# Patient Record
Sex: Male | Born: 1980 | Race: Black or African American | Hispanic: No | Marital: Married | Smoking: Never smoker
Health system: Southern US, Community
[De-identification: ages and names within clinical notes are randomized; demographics above are authoritative.]

---
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
## 2014-06-25 ENCOUNTER — Encounter (HOSPITAL_COMMUNITY): Payer: Self-pay | Admitting: Emergency Medicine

## 2014-06-25 ENCOUNTER — Emergency Department (HOSPITAL_COMMUNITY)
Admission: EM | Admit: 2014-06-25 | Discharge: 2014-06-26 | Disposition: A | Discharge status: Home / Self Care | Payer: Medicaid Other | Source: Home / Self Care

## 2014-06-25 DIAGNOSIS — L600 Ingrowing nail: Secondary | ICD-10-CM

## 2014-06-25 DIAGNOSIS — B999 Unspecified infectious disease: Secondary | ICD-10-CM

## 2014-06-25 MED ORDER — CEPHALEXIN 500 MG PO CAPS: 500.0000 mg | ORAL_CAPSULE | Freq: Three times a day (TID) | ORAL | Status: DC

## 2014-06-25 MED ORDER — CHLORHEXIDINE GLUCONATE 4 % EX SOLN: CUTANEOUS | Status: DC

## 2014-06-25 NOTE — Discharge Instructions (Signed)
Your toe infection needs antibiotics and foot cleansing Please start the antibiotics adn follow up with a primary doctor or podiatrist to have part of your toenails removed Please let your toenails grow out Please use the body cleanser daily on your toes. Please soak your feet daily in war soapy water

## 2014-06-25 NOTE — ED Notes (Signed)
Pt  Reports   Symptoms     Of        painfull   big  Toes      With   What   Appears       To  Be  Ingrown  Toenails           He  Recently  Relocated   From  [place]   [place]    Weeks ago           He  Was on  Amoxicillin    Recently  For  The     Symptoms

## 2014-06-25 NOTE — ED Provider Notes (Signed)
CSN: [id number]     Arrival date & time 06/25/14  1428 History   None    Chief Complaint  Patient presents with  . Toe Pain   (Consider location/radiation/quality/duration/timing/severity/associated sxs/prior Treatment) HPI BIlateral big toe pain: ongoing off and on for 12 years. Current episode for 2 mo. Lateral big toenail bilat ingrown into skin causing bleeding, pain. Occasional purulence. Ibuprofen. PCN injections and oral ABX. Pt came from [place]  1 week ago. No toenail removal in the past. Walks ok. Wears open toed shoes.   History reviewed. No pertinent past medical history. History reviewed. No pertinent past surgical history. Family History  Problem Relation Age of Onset  . Diabetes Mother    History  Substance Use Topics  . Smoking status: Never Smoker   . Smokeless tobacco: Not on file  . Alcohol Use: No    Review of Systems Per HPI with all other pertinent systems negative.   Allergies  Review of patient's allergies indicates no known allergies.  Home Medications   Prior to Admission medications   Medication Sig Start Date End Date Taking? Authorizing Provider  cephALEXin (KEFLEX) 500 MG capsule Take 1 capsule (500 mg total) by mouth 3 (three) times daily. 06/25/14   Ozella Rocksavid J Merrell, MD  Chlorhexidine Gluconate 4 % SOLN Wash toes daily 06/25/14   Ozella Rocksavid J Merrell, MD   There were no vitals taken for this visit. Physical Exam  Constitutional: He is oriented to person, place, and time. He appears well-developed and well-nourished.  HENT:  Head: Normocephalic and atraumatic.  Eyes: EOM are normal. Pupils are equal, round, and reactive to light.  Neck: Normal range of motion.  Cardiovascular: Normal rate and intact distal pulses.  Exam reveals no gallop.   No murmur heard. Pulmonary/Chest: Effort normal. No respiratory distress. He has no wheezes.  Abdominal: Soft. Bowel sounds are normal.  Musculoskeletal: Normal range of motion. He exhibits tenderness.    Neurological: He is oriented to person, place, and time.  Skin: Skin is warm.  Psychiatric: He has a normal mood and affect. His behavior is normal. Thought content normal.      ED Course  Procedures (including critical care time) Labs Review Labs Reviewed  WOUND CULTURE    Imaging Review No results found.   MDM   1. Ingrown toenail   2. Infection   PT would greatly benefit from bilat laterqal 1/3 toenail excision w/ chemical ablation of the nail matrix.  Pt is actively trying to get insurance For now will treat w/ Keflex, warm soapy soaks, and allowing the toenail to grow completely out.   Precautions given and all questions answered   Shelly Flattenavid Merrell, MD Family Medicine 07/04/2014, 3:50 AM      Ozella Rocksavid J Merrell, MD 07/04/14 [phone]

## 2014-06-28 LAB — WOUND CULTURE

## 2014-06-30 NOTE — ED Notes (Signed)
Wound culture R toe: Mod. Staph Aureus.  Pt. adequately treated with Keflex. Jeffrey Lowe, Jeffrey Lowe 06/30/2014

## 2014-07-14 ENCOUNTER — Encounter (HOSPITAL_COMMUNITY): Payer: Self-pay | Admitting: Emergency Medicine

## 2014-07-14 ENCOUNTER — Emergency Department (INDEPENDENT_AMBULATORY_CARE_PROVIDER_SITE_OTHER)
Admission: EM | Admit: 2014-07-14 | Discharge: 2014-07-14 | Disposition: A | Discharge status: Home / Self Care | Payer: Medicaid Other | Source: Home / Self Care | Attending: Family Medicine | Admitting: Family Medicine

## 2014-07-14 DIAGNOSIS — L600 Ingrowing nail: Secondary | ICD-10-CM

## 2014-07-14 NOTE — ED Notes (Signed)
Pt is here today for worsening infection on both great toes, pt says that this problem started 2 months ago and that they were [place], pt said that he has not seen his PCP or a podiatrist

## 2014-07-14 NOTE — Discharge Instructions (Signed)
Soak in warm salt water three times a day. Contact [facility] for appointment. Please do not trim your toenails quite so short. This will only make condition worse.  Ingrown Toenail An ingrown toenail occurs when the sharp edge of your toenail grows into the skin. Causes of ingrown toenails include toenails clipped too far back or poorly fitting shoes. Activities involving sudden stops (basketball, tennis) causing "toe jamming" may lead to an ingrown nail. HOME CARE INSTRUCTIONS   Soak the whole foot in warm soapy water for 20 minutes, 3 times per day.  You may lift the edge of the nail away from the sore skin by wedging a small piece of cotton under the corner of the nail. Be careful not to dig (traumatize) and cause more injury to the area.  Wear shoes that fit well. While the ingrown nail is causing problems, sandals may be beneficial.  Trim your toenails regularly and carefully. Cut your toenails straight across, not in a curve. This will prevent injury to the skin at the corners of the toenail.  Keep your feet clean and dry.  Crutches may be helpful early in treatment if walking is painful.  Antibiotics, if prescribed, should be taken as directed.  Return for a wound check in 2 days or as directed.  Only take over-the-counter or prescription medicines for pain, discomfort, or fever as directed by your caregiver. SEEK IMMEDIATE MEDICAL CARE IF:   You have a fever.  You have increasing pain, redness, swelling, or heat at the wound site.  Your toe is not better in 7 days. If conservative treatment is not successful, surgical removal of a portion or all of the nail may be necessary. MAKE SURE YOU:   Understand these instructions.  Will watch your condition.  Will get help right away if you are not doing well or get worse. Document Released: 08/18/2000 Document Revised: 11/13/2011 Document Reviewed: 08/12/2008 [facility] Patient Information 2015 [employer] [place]. This  information is not intended to replace advice given to you by your health care provider. Make sure you discuss any questions you have with your health care provider.

## 2014-07-14 NOTE — ED Provider Notes (Signed)
CSN: [id number]     Arrival date & time 07/14/14  1028 History   First MD Initiated Contact with Patient 07/14/14 1047     Chief Complaint  Patient presents with  . Toe Injury   (Consider location/radiation/quality/duration/timing/severity/associated sxs/prior Treatment) HPI Comments: BIlateral big toe pain: ongoing off and on for 12 years. Current episode for 2 mo. Lateral big toenail bilat ingrown into skin causing bleeding, pain. Occasional purulence. Using warm water soaks and Ibuprofen at home. Pt came from [place] 1 [place] ago. No toenail removal in the past. Walks ok. Wears open toed shoes. Has been seen here in past for same and has not followed up with PCP. Denies fever/chills  The history is provided by the patient.    History reviewed. No pertinent past medical history. History reviewed. No pertinent past surgical history. Family History  Problem Relation Age of Onset  . Diabetes Mother    History  Substance Use Topics  . Smoking status: Never Smoker   . Smokeless tobacco: Not on file  . Alcohol Use: No    Review of Systems  All other systems reviewed and are negative.   Allergies  Review of patient's allergies indicates no known allergies.  Home Medications   Prior to Admission medications   Medication Sig Start Date End Date Taking? Authorizing Provider  cephALEXin (KEFLEX) 500 MG capsule Take 1 capsule (500 mg total) by mouth 3 (three) times daily. 06/25/14   Ozella Rocksavid J Merrell, MD  Chlorhexidine Gluconate 4 % SOLN Wash toes daily 06/25/14   Ozella Rocksavid J Merrell, MD   BP 135/78 mmHg  Pulse 65  Temp(Src) 98 F (36.7 C) (Oral)  Resp 12  SpO2 100% Physical Exam  Constitutional: He is oriented to person, place, and time. He appears well-developed and well-nourished. No distress.  HENT:  Head: Normocephalic and atraumatic.  Eyes: Conjunctivae are normal. No scleral icterus.  Cardiovascular: Normal rate.   Pulmonary/Chest: Effort normal.  Musculoskeletal: Normal  range of motion.  Neurological: He is alert and oriented to person, place, and time.  Skin: Skin is warm and dry.  Ingrown toenails, bilateral lateral great toe nailbeds with large mounds of associated granulation tissue. No indication of cellulitis/infection/abscess  Psychiatric: He has a normal mood and affect. His behavior is normal.  Nursing note and vitals reviewed.   ED Course  Procedures (including critical care time) Labs Review Labs Reviewed - No data to display  Imaging Review No results found.   MDM   1. Ingrown left big toenail   2. Ingrown right greater toenail    Referred to [facility]. Warm salt water soaks TID. Limit trimming nails quite so short.    Ria ClockJennifer Lee H Arihanna Estabrook, [place] 07/14/14 [phone]

## 2014-08-06 ENCOUNTER — Ambulatory Visit (INDEPENDENT_AMBULATORY_CARE_PROVIDER_SITE_OTHER): Payer: Medicaid Other | Admitting: Podiatry

## 2014-08-06 ENCOUNTER — Encounter: Payer: Self-pay | Admitting: Podiatry

## 2014-08-06 VITALS — BP 132/80 | HR 61 | Temp 98.8°F | Resp 11

## 2014-08-06 DIAGNOSIS — L03039 Cellulitis of unspecified toe: Secondary | ICD-10-CM

## 2014-08-06 MED ORDER — CEPHALEXIN 500 MG PO CAPS: 500.0000 mg | ORAL_CAPSULE | Freq: Three times a day (TID) | ORAL | Status: DC

## 2014-08-06 NOTE — Progress Notes (Signed)
Subjective:     Patient ID: Jeffrey Lowe, male   DOB: 11/06/1980, 33 y.o.   MRN: [id number]  HPI patient states I have to very bad infected ingrown toenails that have been treated with antibiotics and they have not really improved and I was referred here for treatment   Review of Systems  All other systems reviewed and are negative.      Objective:   Physical Exam  Constitutional: He is oriented to person, place, and time.  Cardiovascular: Intact distal pulses.   Musculoskeletal: Normal range of motion.  Neurological: He is oriented to person, place, and time.  Skin: Skin is warm.  Nursing note and vitals reviewed.  neurovascular status intact with muscle strength adequate range of motion subtalar and midtarsal joint within normal limits. Patient is noted to have an inflamed infected area left hallux lateral border right hallux medial border that are localized in nature with quite a bit of necrotic tissue on both. Digits are well-perfused and patient is well oriented     Assessment:     Severe paronychia infection hallux both feet    Plan:     H&P and condition reviewed x-ray length with patient. Today I infiltrated each hallux 60 mg Xylocaine Marcaine mixture remove the lateral border left hallux proud flesh abscess tissue and allowed channel for drainage and medial border right hallux removing proud flesh abscess tissue and allowing channel for drainage. Applied sterile dressings placed on cephalexin 500 mg 3 times a day and reappoint in 5 weeks for permanent correction or earlier if needed

## 2014-08-06 NOTE — Progress Notes (Signed)
   Subjective:    Patient ID: Jeffrey Lowe, male    DOB: 09/23/1980, 33 y.o.   MRN: [id number]  HPI Comments: Pt [facility], with discolored, swollen B/L 1st toes.  Pt states the toes have been this way for 3 months.     Review of Systems  All other systems reviewed and are negative.      Objective:   Physical Exam        Assessment & Plan:

## 2014-08-06 NOTE — Patient Instructions (Signed)

## 2014-08-21 ENCOUNTER — Ambulatory Visit (INDEPENDENT_AMBULATORY_CARE_PROVIDER_SITE_OTHER): Payer: Medicaid Other | Admitting: Family Medicine

## 2014-08-21 ENCOUNTER — Encounter: Payer: Self-pay | Admitting: Family Medicine

## 2014-08-21 VITALS — BP 109/62 | HR 58 | Temp 97.5°F | Ht 72.0 in | Wt 199.9 lb

## 2014-08-21 DIAGNOSIS — R894 Abnormal immunological findings in specimens from other organs, systems and tissues: Secondary | ICD-10-CM

## 2014-08-21 DIAGNOSIS — R768 Other specified abnormal immunological findings in serum: Secondary | ICD-10-CM | POA: Insufficient documentation

## 2014-08-21 DIAGNOSIS — Z0289 Encounter for other administrative examinations: Secondary | ICD-10-CM

## 2014-08-21 DIAGNOSIS — Z008 Encounter for other general examination: Secondary | ICD-10-CM

## 2014-08-21 LAB — CBC WITH DIFFERENTIAL/PLATELET
Basophils Absolute: 0 10*3/uL (ref 0.0–0.1)
Basophils Relative: 0 % (ref 0–1)
EOS ABS: 0.3 10*3/uL (ref 0.0–0.7)
Eosinophils Relative: 8 % — ABNORMAL HIGH (ref 0–5)
HCT: 45.1 % (ref 39.0–52.0)
HEMOGLOBIN: 15.7 g/dL (ref 13.0–17.0)
Lymphocytes Relative: 46 % (ref 12–46)
Lymphs Abs: 1.7 10*3/uL (ref 0.7–4.0)
MCH: 31.1 pg (ref 26.0–34.0)
MCHC: 34.8 g/dL (ref 30.0–36.0)
MCV: 89.3 fL (ref 78.0–100.0)
MONO ABS: 0.3 10*3/uL (ref 0.1–1.0)
MPV: 10.2 fL (ref 9.4–12.4)
Monocytes Relative: 7 % (ref 3–12)
NEUTROS PCT: 39 % — AB (ref 43–77)
Neutro Abs: 1.4 10*3/uL — ABNORMAL LOW (ref 1.7–7.7)
Platelets: 184 10*3/uL (ref 150–400)
RBC: 5.05 MIL/uL (ref 4.22–5.81)
RDW: 14.4 % (ref 11.5–15.5)
WBC: 3.6 10*3/uL — ABNORMAL LOW (ref 4.0–10.5)

## 2014-08-21 LAB — COMPREHENSIVE METABOLIC PANEL
ALT: 40 U/L (ref 0–53)
AST: 32 U/L (ref 0–37)
Albumin: 4.2 g/dL (ref 3.5–5.2)
Alkaline Phosphatase: 73 U/L (ref 39–117)
BILIRUBIN TOTAL: 1 mg/dL (ref 0.2–1.2)
BUN: 12 mg/dL (ref 6–23)
CO2: 29 meq/L (ref 19–32)
CREATININE: 1.11 mg/dL (ref 0.50–1.35)
Calcium: 9.3 mg/dL (ref 8.4–10.5)
Chloride: 102 mEq/L (ref 96–112)
Glucose, Bld: 81 mg/dL (ref 70–99)
Potassium: 4 mEq/L (ref 3.5–5.3)
Sodium: 140 mEq/L (ref 135–145)
Total Protein: 7.5 g/dL (ref 6.0–8.3)
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
## 2014-08-21 NOTE — Patient Instructions (Addendum)
Great to meet you!  For your back pain:  Stretch like Dr. Gwendolyn GrantWalden showed you every 30 minutes to an hour Continue massages  Try ibuprofen, Take 2 pills (400 mg) every 6 hours as needed for pain  You should see a dentist Follow up in 6 months
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
## 2014-08-22 LAB — ACUTE HEP PANEL AND HEP B SURFACE AB
HCV AB: NEGATIVE
HEP A IGM: NONREACTIVE
HEP B S AG: POSITIVE — AB
Hep B C IgM: NONREACTIVE
Hep B S Ab: NEGATIVE

## 2014-08-22 LAB — HEPATITIS B SURF AG CONFIRMATION: Hepatitis B Surf Ag Confirmation: POSITIVE — AB

## 2014-09-10 ENCOUNTER — Encounter: Payer: Self-pay | Admitting: Podiatry

## 2014-09-10 ENCOUNTER — Ambulatory Visit (INDEPENDENT_AMBULATORY_CARE_PROVIDER_SITE_OTHER): Payer: Medicaid Other | Admitting: Podiatry

## 2014-09-10 VITALS — BP 132/80 | HR 61 | Resp 16

## 2014-09-10 DIAGNOSIS — L600 Ingrowing nail: Secondary | ICD-10-CM

## 2014-09-10 NOTE — Patient Instructions (Signed)

## 2014-09-10 NOTE — Progress Notes (Signed)
Subjective:     Patient ID: Jerene DillingPrince Rauls, male   DOB: [birth year], 34 y.o.   MRN: [id number]  HPI patient presents stating my nails have healed very well and I like them fixed permanently   Review of Systems     Objective:   Physical Exam Neurovascular status is intact with muscle strength adequate and range of motion subtalar midtarsal joint within normal limits with incurvated hallux borders lateral bilateral that if healed well but are continuing to be ingrown    Assessment:     Healed from paronychia infection with ingrown toenail bilateral    Plan:     H&P performed and discussed condition. I've recommended removal of the corners and explained the surgery and at this time I infiltrated each hallux 60 mg Xylocaine Marcaine mixture and remove the lateral borders exposing matrix and applying phenol 3 applications 30 seconds followed by alcohol lavaged and sterile dressings. Gave instructions on soaks and reappoint

## 2014-10-19 ENCOUNTER — Emergency Department (HOSPITAL_COMMUNITY)
Admission: EM | Admit: 2014-10-19 | Discharge: 2014-10-19 | Disposition: A | Discharge status: Home / Self Care | Payer: Medicaid Other | Source: Home / Self Care | Attending: Family Medicine | Admitting: Family Medicine

## 2014-10-19 ENCOUNTER — Encounter (HOSPITAL_COMMUNITY): Payer: Self-pay | Admitting: Emergency Medicine

## 2014-10-19 DIAGNOSIS — M25512 Pain in left shoulder: Secondary | ICD-10-CM

## 2014-10-19 DIAGNOSIS — B354 Tinea corporis: Secondary | ICD-10-CM

## 2014-10-19 MED ORDER — CYCLOBENZAPRINE HCL 10 MG PO TABS: 10.0000 mg | ORAL_TABLET | Freq: Two times a day (BID) | ORAL | Status: DC | PRN

## 2014-10-19 MED ORDER — TERBINAFINE HCL 250 MG PO TABS: 250.0000 mg | ORAL_TABLET | Freq: Every day | ORAL | Status: DC

## 2014-10-19 MED ORDER — CLOTRIMAZOLE-BETAMETHASONE 1-0.05 % EX CREA
TOPICAL_CREAM | CUTANEOUS | Status: DC
Start: 2014-10-19 — End: 2015-12-15

## 2014-10-19 NOTE — ED Provider Notes (Signed)
CSN: [id number]     Arrival date & time 10/19/14  1526 History   First MD Initiated Contact with Patient 10/19/14 1631     Chief Complaint  Patient presents with  . Rash   (Consider location/radiation/quality/duration/timing/severity/associated sxs/prior Treatment) HPI              34 year old male presents complaining of left shoulder pain as well as a rash. He is of the rash for about 3 weeks. It started on the inside of his left thigh and has since spread to the opposite thigh, right shoulder. It is itchy and raised. No systemic symptoms, no recent travel. No treatment stridor home. Also he complains of left shoulder pain. He does a lot of heavy lifting at work and he thinks this is muscular pain. He has been taking ibuprofen without much relief. Denies any specific injury. No numbness or weakness.  History reviewed. No pertinent past medical history. Past Surgical History  Procedure Laterality Date  . Hernia repair  2002   Family History  Problem Relation Age of Onset  . Diabetes Mother   . Hepatitis B Brother     Tested positive at same time as him   History  Substance Use Topics  . Smoking status: Never Smoker   . Smokeless tobacco: Not on file  . Alcohol Use: No    Review of Systems  Constitutional: Negative for fever and chills.  Musculoskeletal: Positive for myalgias and joint swelling. Negative for back pain, neck pain and neck stiffness.  Skin: Positive for rash.  All other systems reviewed and are negative.   Allergies  Review of patient's allergies indicates no known allergies.  Home Medications   Prior to Admission medications   Medication Sig Start Date End Date Taking? Authorizing Provider  Chlorhexidine Gluconate 4 % SOLN Wash toes daily 06/25/14   Ozella Rocks, MD  clotrimazole-betamethasone (LOTRISONE) cream Apply to affected area 2 times daily 10/19/14   Graylon Good, PA-C  cyclobenzaprine (FLEXERIL) 10 MG tablet Take 1 tablet (10 mg total) by  mouth 2 (two) times daily as needed for muscle spasms. 10/19/14   Graylon Good, PA-C  terbinafine (LAMISIL) 250 MG tablet Take 1 tablet (250 mg total) by mouth daily. 10/19/14   Adrian Blackwater Easton Fetty, PA-C   BP 125/81 mmHg  Pulse 60  Temp(Src) 97.1 F (36.2 C) (Oral)  Resp 16  SpO2 100% Physical Exam  Constitutional: He is oriented to person, place, and time. He appears well-developed and well-nourished. No distress.  HENT:  Head: Normocephalic.  Cardiovascular: Normal rate, regular rhythm and normal heart sounds.   Pulses:      Radial pulses are 2+ on the right side, and 2+ on the left side.  Pulmonary/Chest: Effort normal and breath sounds normal. No respiratory distress.  Abdominal: He exhibits no mass. There is no tenderness. There is no rebound and no guarding.  Musculoskeletal:       Thoracic back: He exhibits tenderness (left upper trapezius area ). He exhibits no bony tenderness.  Neurological: He is alert and oriented to person, place, and time. No sensory deficit. Coordination normal.  Skin: Skin is warm and dry. Rash (circumscribed rash with raised rolled edge and central clearing on medial distal thighs, right shoulder, left upper back ) noted. He is not diaphoretic.  Psychiatric: He has a normal mood and affect. Judgment normal.  Nursing note and vitals reviewed.   ED Course  Procedures (including critical care time) Labs Review Labs Reviewed -  No data to display  Imaging Review No results found.   MDM   1. Tinea corporis   2. Left shoulder pain    This has the appearance of tinea corporis. It is widespread, treat with terbinafine as well as Lotrisone cream. Flexeril when necessary for shoulder pain and continue ibuprofen. Follow-up when necessary  Meds ordered this encounter  Medications  . terbinafine (LAMISIL) 250 MG tablet    Sig: Take 1 tablet (250 mg total) by mouth daily.    Dispense:  14 tablet    Refill:  0  . clotrimazole-betamethasone (LOTRISONE)  cream    Sig: Apply to affected area 2 times daily    Dispense:  45 g    Refill:  0  . cyclobenzaprine (FLEXERIL) 10 MG tablet    Sig: Take 1 tablet (10 mg total) by mouth 2 (two) times daily as needed for muscle spasms.    Dispense:  20 tablet    Refill:  0     Graylon GoodZachary H Ercilia Bettinger, PA-C 10/19/14 2023

## 2014-10-19 NOTE — Discharge Instructions (Signed)
Body Ringworm Ringworm (tinea corporis) is a fungal infection of the skin on the body. This infection is not caused by worms, but is actually caused by a fungus. Fungus normally lives on the top of your skin and can be useful. However, in the case of ringworms, the fungus grows out of control and causes a skin infection. It can involve any area of skin on the body and can spread easily from one person to another (contagious). Ringworm is a common problem for children, but it can affect adults as well. Ringworm is also often found in athletes, especially wrestlers who share equipment and mats.  CAUSES  Ringworm of the body is caused by a fungus called dermatophyte. It can spread by:  Touchingother people who are infected.  Touchinginfected pets.  Touching or sharingobjects that have been in contact with the infected person or pet (hats, combs, towels, clothing, sports equipment). SYMPTOMS   Itchy, raised red spots and bumps on the skin.  Ring-shaped rash.  Redness near the border of the rash with a clear center.  Dry and scaly skin on or around the rash. Not every person develops a ring-shaped rash. Some develop only the red, scaly patches. DIAGNOSIS  Most often, ringworm can be diagnosed by performing a skin exam. Your caregiver may choose to take a skin scraping from the affected area. The sample will be examined under the microscope to see if the fungus is present.  TREATMENT  Body ringworm may be treated with a topical antifungal cream or ointment. Sometimes, an antifungal shampoo that can be used on your body is prescribed. You may be prescribed antifungal medicines to take by mouth if your ringworm is severe, keeps coming back, or lasts a long time.  HOME CARE INSTRUCTIONS   Only take over-the-counter or prescription medicines as directed by your caregiver.  Wash the infected area and dry it completely before applying yourcream or ointment.  When using antifungal shampoo to  treat the ringworm, leave the shampoo on the body for 3-5 minutes before rinsing.   Wear loose clothing to stop clothes from rubbing and irritating the rash.  Wash or change your bed sheets every night while you have the rash.  Have your pet treated by your veterinarian if it has the same infection. To prevent ringworm:   Practice good hygiene.  Wear sandals or shoes in public places and showers.  Do not share personal items with others.  Avoid touching red patches of skin on other people.  Avoid touching pets that have bald spots or wash your hands after doing so. SEEK MEDICAL CARE IF:   Your rash continues to spread after 7 days of treatment.  Your rash is not gone in 4 weeks.  The area around your rash becomes red, warm, tender, and swollen. Document Released: 08/18/2000 Document Revised: 05/15/2012 Document Reviewed: 03/04/2012 [facility] Patient Information 2015 [place], [place]. This information is not intended to replace advice given to you by your health care provider. Make sure you discuss any questions you have with your health care provider.  Shoulder Pain The shoulder is the joint that connects your arms to your body. The bones that form the shoulder joint include the upper arm bone (humerus), the shoulder blade (scapula), and the collarbone (clavicle). The top of the humerus is shaped like a ball and fits into a rather flat socket on the scapula (glenoid cavity). A combination of muscles and strong, fibrous tissues that connect muscles to bones (tendons) support your shoulder joint  and hold the ball in the socket. Small, fluid-filled sacs (bursae) are located in different areas of the joint. They act as cushions between the bones and the overlying soft tissues and help reduce friction between the gliding tendons and the bone as you move your arm. Your shoulder joint allows a wide range of motion in your arm. This range of motion allows you to do things like scratch your back  or throw a ball. However, this range of motion also makes your shoulder more prone to pain from overuse and injury. Causes of shoulder pain can originate from both injury and overuse and usually can be grouped in the following four categories:  Redness, swelling, and pain (inflammation) of the tendon (tendinitis) or the bursae (bursitis).  Instability, such as a dislocation of the joint.  Inflammation of the joint (arthritis).  Broken bone (fracture). HOME CARE INSTRUCTIONS   Apply ice to the sore area.  Put ice in a plastic bag.  Place a towel between your skin and the bag.  Leave the ice on for 15-20 minutes, 3-4 times per day for the first 2 days, or as directed by your health care provider.  Stop using cold packs if they do not help with the pain.  If you have a shoulder sling or immobilizer, wear it as long as your caregiver instructs. Only remove it to shower or bathe. Move your arm as little as possible, but keep your hand moving to prevent swelling.  Squeeze a soft ball or foam pad as much as possible to help prevent swelling.  Only take over-the-counter or prescription medicines for pain, discomfort, or fever as directed by your caregiver. SEEK MEDICAL CARE IF:   Your shoulder pain increases, or new pain develops in your arm, hand, or fingers.  Your hand or fingers become cold and numb.  Your pain is not relieved with medicines. SEEK IMMEDIATE MEDICAL CARE IF:   Your arm, hand, or fingers are numb or tingling.  Your arm, hand, or fingers are significantly swollen or turn white or blue. MAKE SURE YOU:   Understand these instructions.  Will watch your condition.  Will get help right away if you are not doing well or get worse. Document Released: 05/31/2005 Document Revised: 01/05/2014 Document Reviewed: 08/05/2011 [facility] Patient Information 2015 [place], [place]. This information is not intended to replace advice given to you by your health care provider. Make  sure you discuss any questions you have with your health care provider.  Musculoskeletal Pain Musculoskeletal pain is muscle and boney aches and pains. These pains can occur in any part of the body. Your caregiver may treat you without knowing the cause of the pain. They may treat you if blood or urine tests, X-rays, and other tests were normal.  CAUSES There is often not a definite cause or reason for these pains. These pains may be caused by a type of germ (virus). The discomfort may also come from overuse. Overuse includes working out too hard when your body is not fit. Boney aches also come from weather changes. Bone is sensitive to atmospheric pressure changes. HOME CARE INSTRUCTIONS   Ask when your test results will be ready. Make sure you get your test results.  Only take over-the-counter or prescription medicines for pain, discomfort, or fever as directed by your caregiver. If you were given medications for your condition, do not drive, operate machinery or power tools, or sign legal documents for 24 hours. Do not drink alcohol. Do not take  sleeping pills or other medications that may interfere with treatment.  Continue all activities unless the activities cause more pain. When the pain lessens, slowly resume normal activities. Gradually increase the intensity and duration of the activities or exercise.  During periods of severe pain, bed rest may be helpful. Lay or sit in any position that is comfortable.  Putting ice on the injured area.  Put ice in a bag.  Place a towel between your skin and the bag.  Leave the ice on for 15 to 20 minutes, 3 to 4 times a day.  Follow up with your caregiver for continued problems and no reason can be found for the pain. If the pain becomes worse or does not go away, it may be necessary to repeat tests or do additional testing. Your caregiver may need to look further for a possible cause. SEEK IMMEDIATE MEDICAL CARE IF:  You have pain that is  getting worse and is not relieved by medications.  You develop chest pain that is associated with shortness or breath, sweating, feeling sick to your stomach (nauseous), or throw up (vomit).  Your pain becomes localized to the abdomen.  You develop any new symptoms that seem different or that concern you. MAKE SURE YOU:   Understand these instructions.  Will watch your condition.  Will get help right away if you are not doing well or get worse. Document Released: 08/21/2005 Document Revised: 11/13/2011 Document Reviewed: 04/25/2013 [facility] Patient Information 2015 [place], [place]. This information is not intended to replace advice given to you by your health care provider. Make sure you discuss any questions you have with your health care provider.

## 2014-10-19 NOTE — ED Notes (Signed)
Reports 3 week history of rash to shoulder, right, bilateral thighs, pain in left shoulder

## 2015-01-08 ENCOUNTER — Telehealth: Payer: Self-pay | Admitting: Family Medicine

## 2015-01-08 DIAGNOSIS — B181 Chronic viral hepatitis B without delta-agent: Secondary | ICD-10-CM

## 2015-01-08 NOTE — Telephone Encounter (Signed)
Jeffrey Lowe presented to clinic with his wife earlier in the week.  He had questions about his recent lab tests, which showed positive Hepatitis surface antigen, demonstrating chronic HBV state.  Jeffrey Lowe is interested in talking with an infectious disease specialist about possible treatment options or at least just to learn more about his disease.  I will place a referral today to ID.

## 2015-01-18 NOTE — Telephone Encounter (Signed)
Spoke with patient and he is aware of appt.  Also aware of date and time. Aleene Swanner,CMA

## 2015-01-18 NOTE — Telephone Encounter (Signed)
Please call Jeffrey Lowe to let him know he has an ID appt on May 25 to discuss his hepatitis.  He is awaiting a phone call from our clinic about this.  Let me know if you have questions.  THanks!  JW

## 2015-01-27 ENCOUNTER — Ambulatory Visit: Payer: Medicaid Other | Admitting: Internal Medicine

## 2015-02-16 ENCOUNTER — Ambulatory Visit: Payer: Medicaid Other | Admitting: Internal Medicine

## 2015-02-16 ENCOUNTER — Telehealth: Payer: Self-pay | Admitting: *Deleted

## 2015-02-16 NOTE — Telephone Encounter (Signed)
Made the pt a new appt with Dr. Orvan Falconer for 03/22/15.

## 2015-03-22 ENCOUNTER — Ambulatory Visit: Payer: Medicaid Other | Admitting: Internal Medicine

## 2015-04-01 ENCOUNTER — Encounter: Payer: Self-pay | Admitting: Internal Medicine

## 2015-04-01 ENCOUNTER — Ambulatory Visit (INDEPENDENT_AMBULATORY_CARE_PROVIDER_SITE_OTHER): Payer: Medicaid Other | Admitting: Internal Medicine

## 2015-04-01 DIAGNOSIS — R894 Abnormal immunological findings in specimens from other organs, systems and tissues: Secondary | ICD-10-CM | POA: Diagnosis present

## 2015-04-01 DIAGNOSIS — R768 Other specified abnormal immunological findings in serum: Secondary | ICD-10-CM

## 2015-04-01 LAB — COMPREHENSIVE METABOLIC PANEL
ALT: 34 U/L (ref 9–46)
AST: 27 U/L (ref 10–40)
Albumin: 4.3 g/dL (ref 3.6–5.1)
Alkaline Phosphatase: 63 U/L (ref 40–115)
BUN: 16 mg/dL (ref 7–25)
CALCIUM: 9.4 mg/dL (ref 8.6–10.3)
CHLORIDE: 102 meq/L (ref 98–110)
CO2: 29 mEq/L (ref 20–31)
Creat: 1.17 mg/dL (ref 0.60–1.35)
Glucose, Bld: 85 mg/dL (ref 65–99)
Potassium: 4.5 mEq/L (ref 3.5–5.3)
SODIUM: 139 meq/L (ref 135–146)
Total Bilirubin: 0.7 mg/dL (ref 0.2–1.2)
Total Protein: 7.2 g/dL (ref 6.1–8.1)

## 2015-04-01 LAB — CBC
HEMATOCRIT: 43.9 % (ref 39.0–52.0)
Hemoglobin: 15.4 g/dL (ref 13.0–17.0)
MCH: 31.2 pg (ref 26.0–34.0)
MCHC: 35.1 g/dL (ref 30.0–36.0)
MCV: 89 fL (ref 78.0–100.0)
MPV: 10.1 fL (ref 8.6–12.4)
Platelets: 172 10*3/uL (ref 150–400)
RBC: 4.93 MIL/uL (ref 4.22–5.81)
RDW: 13.3 % (ref 11.5–15.5)
WBC: 4.1 10*3/uL (ref 4.0–10.5)

## 2015-04-01 NOTE — Progress Notes (Signed)
Patient ID: Jeffrey Lowe, male   DOB: 11-28-1980, 34 y.o.   MRN: [id number]         [facility] Infectious Disease  Reason for Consult: Chronic hepatitis B Referring Physician: Dr. Payton Lowe  Patient Active Problem List   Diagnosis Date Noted  . Hepatitis B antibody positive 08/21/2014  . Refugee health examination 08/21/2014    Patient's Medications  New Prescriptions   No medications on file  Previous Medications   CHLORHEXIDINE GLUCONATE 4 % SOLN    Wash toes daily   CLOTRIMAZOLE-BETAMETHASONE (LOTRISONE) CREAM    Apply to affected area 2 times daily   CYCLOBENZAPRINE (FLEXERIL) 10 MG TABLET    Take 1 tablet (10 mg total) by mouth 2 (two) times daily as needed for muscle spasms.   TERBINAFINE (LAMISIL) 250 MG TABLET    Take 1 tablet (250 mg total) by mouth daily.  Modified Medications   No medications on file  Discontinued Medications   No medications on file    Recommendations: 1. Repeat CBC and complete metabolic panel 2. HIV antibody 3. Hepatitis A antibody 4. Hepatitis D antibody 5. Hepatitis B e antigen and antibody 6. Hepatitis B DNA viral load   Assessment: He has chronic hepatitis B. It is unclear how it was acquired. Vertical transmission from his mother is a possibility but he may have been exposed in [place] through contact with other people's blood during the war or with contaminated injections. He does not believe he could of had it through sexual contact. He needs further evaluation to see if he might be a candidate for treatment versus watchful waiting and periodic blood work.  HPI: Jeffrey Lowe is a 34 y.o. male who was born in the [place]. His family was torn apart by Civil War. He lived for a while in [place] and then a refugee camp in [place] before immigrating to the [place] last fall. When he came here he was screened and found to have hepatitis B surface antigen positive. His liver enzymes were normal. One brother who immigrated  with him also tested positive for hepatitis B. He knows that his mother had diabetes but he does not know if she is ever been tested for hepatitis. He has never had a blood transfusion but during the war he was exposed to lots of other people who had bloody wounds. 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 He has never had a blood transfusion. He has never had a tattoo. He has never injected drugs. He is married. His wife has tested negative for hepatitis B. He believes that she has received hepatitis B vaccine. He is healthy and on no medications.  Review of Systems: Constitutional: negative Eyes: negative Ears, nose, mouth, throat, and face: negative Respiratory: negative Cardiovascular: negative Gastrointestinal: negative Genitourinary:negative    No past medical history on file.  History  Substance Use Topics  . Smoking status: Never Smoker   . Smokeless tobacco: Not on file  . Alcohol Use: No    Family History  Problem Relation Age of Onset  . Diabetes Mother   . Hepatitis B Brother     Tested positive at same time as him   No Known Allergies  OBJECTIVE: Blood pressure 111/72, pulse 54, temperature 98.6 F (37 C), temperature source Oral, height 5' 6.93" (1.7 m), weight 202 lb 8 oz (91.853 kg). General: He is healthy and in no distress Skin: No rash Lymph nodes: No palpable adenopathy  Lungs: Clear Cor: Regular S1 and S2 with no murmurs Abdomen: Soft and nontender with no palpable masses   Microbiology: No results found for this or any previous visit (from the past 240 hour(s)).  Jeffrey Asters, MD [facility] for Infectious Disease [facility] [phone] pager   [phone] cell 04/01/2015, 5:26 PM

## 2015-04-02 LAB — HIV ANTIBODY (ROUTINE TESTING W REFLEX): HIV: NONREACTIVE

## 2015-04-02 LAB — HEPATITIS A ANTIBODY, TOTAL: HEP A TOTAL AB: REACTIVE — AB

## 2015-04-05 LAB — HEPATITIS B DNA, ULTRAQUANTITATIVE, PCR
HEPATITIS B DNA (CALC): 501 {copies}/mL — AB (ref <116)
HEPATITIS B DNA: 86 [IU]/mL — AB (ref <20)

## 2015-04-07 LAB — HEPATITIS DELTA ANTIBODY: Hepatitis D Ab, Total: NEGATIVE

## 2015-05-06 ENCOUNTER — Ambulatory Visit (INDEPENDENT_AMBULATORY_CARE_PROVIDER_SITE_OTHER): Payer: Medicaid Other | Admitting: Internal Medicine

## 2015-05-06 ENCOUNTER — Encounter: Payer: Self-pay | Admitting: Internal Medicine

## 2015-05-06 ENCOUNTER — Telehealth: Payer: Self-pay | Admitting: *Deleted

## 2015-05-06 VITALS — BP 106/71 | HR 56 | Temp 98.1°F | Wt 203.0 lb

## 2015-05-06 DIAGNOSIS — Z23 Encounter for immunization: Secondary | ICD-10-CM

## 2015-05-06 DIAGNOSIS — R894 Abnormal immunological findings in specimens from other organs, systems and tissues: Secondary | ICD-10-CM | POA: Diagnosis not present

## 2015-05-06 DIAGNOSIS — R768 Other specified abnormal immunological findings in serum: Secondary | ICD-10-CM

## 2015-05-06 NOTE — Telephone Encounter (Signed)
Requested pt call to schedule lab and MD appts.  The lab appt needs to be 2 weeks prior to the MD appts.

## 2015-05-06 NOTE — Assessment & Plan Note (Signed)
He has chronic hepatitis B. Unfortunately his e antigen and e antibody were not obtained at the time of his last visit. These tests will be important to get more per slice staging. However his liver enzymes remain normal and his viral load is very low. I do not feel he needs any treatment at this time. I will obtain an ultrasound with elastography to establish a baseline and see him back after a full set of lab work in 6 months.

## 2015-05-06 NOTE — Addendum Note (Signed)
Addended by: Jennet Maduro D on: 05/06/2015 11:42 AM   Modules accepted: Orders

## 2015-05-06 NOTE — Telephone Encounter (Addendum)
Pt arrived late for appt.  Was seen by Dr. Orvan Falconer.  Dr. Orvan Falconer ordered an abdominal ultrasound/elastography for the pt.  It is scheduled for 06/03/15 at 0800 at [facility].  Pt to arrive at 0745, NPO for 6 hours.  Letter with this information was sent to the pt.

## 2015-05-06 NOTE — Progress Notes (Signed)
Patient ID: Jeffrey Lowe, male   DOB: 04-23-1981, 34 y.o.   MRN: [id number]         [facility] for Infectious Disease  Patient Active Problem List   Diagnosis Date Noted  . Hepatitis B antibody positive 08/21/2014  . Refugee health examination 08/21/2014    Patient's Medications  New Prescriptions   No medications on file  Previous Medications   CHLORHEXIDINE GLUCONATE 4 % SOLN    Wash toes daily   CLOTRIMAZOLE-BETAMETHASONE (LOTRISONE) CREAM    Apply to affected area 2 times daily   CYCLOBENZAPRINE (FLEXERIL) 10 MG TABLET    Take 1 tablet (10 mg total) by mouth 2 (two) times daily as needed for muscle spasms.   TERBINAFINE (LAMISIL) 250 MG TABLET    Take 1 tablet (250 mg total) by mouth daily.  Modified Medications   No medications on file  Discontinued Medications   No medications on file    Subjective: He is feeling well without any problems or complaints.  Review of Systems: Pertinent items are noted in HPI.  No past medical history on file.  Social History  Substance Use Topics  . Smoking status: Never Smoker   . Smokeless tobacco: None  . Alcohol Use: No    Family History  Problem Relation Age of Onset  . Diabetes Mother   . Hepatitis B Brother     Tested positive at same time as him    No Known Allergies  Objective: Filed Vitals:   05/06/15 1059  BP: 106/71  Pulse: 56  Temp: 98.1 F (36.7 C)  TempSrc: Oral  Weight: 203 lb (92.08 kg)   Body mass index is 31.86 kg/(m^2).  General: He is in good spirits Skin: No rash Oral: He has 1 small ulcer on his right upper lip where he bit himself Lungs: Clear Cor: Regular S1 and S2 with no murmur Abdomen: Soft and nontender with no palpable masses   Lab Results Hepatitis B DNA viral load 04/01/2015: 86 international units per mL Hepatitis A antibody positive Hepatitis C antibody negative Hepatitis D antibody negative   Problem List Items Addressed This Visit      Unprioritized   Hepatitis B antibody positive    He has chronic hepatitis B. Unfortunately his e antigen and e antibody were not obtained at the time of his last visit. These tests will be important to get more per slice staging. However his liver enzymes remain normal and his viral load is very low. I do not feel he needs any treatment at this time. I will obtain an ultrasound with elastography to establish a baseline and see him back after a full set of lab work in 6 months.      Relevant Orders   Hepatitis B surface antibody   Hepatitis B DNA, ultraquantitative, PCR   Hepatitis B e antibody   Hepatitis B e antigen   Hepatitis B surface antigen   Comprehensive metabolic panel   AFP tumor marker   US ABDOMEN COMPLETE Deeann Saint, MD [facility] [phone] pager   [phone] cell 05/06/2015, 11:28 AM

## 2015-05-28 ENCOUNTER — Ambulatory Visit: Payer: Medicaid Other | Admitting: Family Medicine

## 2015-06-03 ENCOUNTER — Ambulatory Visit (HOSPITAL_COMMUNITY): Payer: Medicaid Other

## 2015-07-01 ENCOUNTER — Encounter (HOSPITAL_COMMUNITY): Payer: Self-pay | Admitting: Emergency Medicine

## 2015-07-01 ENCOUNTER — Emergency Department (HOSPITAL_COMMUNITY)
Admission: EM | Admit: 2015-07-01 | Discharge: 2015-07-01 | Disposition: A | Discharge status: Home / Self Care | Payer: Medicaid Other | Source: Home / Self Care | Attending: Family Medicine | Admitting: Family Medicine

## 2015-07-01 DIAGNOSIS — B349 Viral infection, unspecified: Secondary | ICD-10-CM

## 2015-07-01 DIAGNOSIS — R0982 Postnasal drip: Secondary | ICD-10-CM

## 2015-07-01 DIAGNOSIS — G44219 Episodic tension-type headache, not intractable: Secondary | ICD-10-CM

## 2015-07-01 NOTE — Discharge Instructions (Signed)
Tension Headache A tension headache is a feeling of pain, pressure, or aching that is often felt over the front and sides of the head. The pain can be dull, or it can feel tight (constricting). Tension headaches are not normally associated with nausea or vomiting, and they do not get worse with physical activity. Tension headaches can last from 30 minutes to several days. This is the most common type of headache. CAUSES The exact cause of this condition is not known. Tension headaches often begin after stress, anxiety, or depression. Other triggers may include:  Alcohol.  Too much caffeine, or caffeine withdrawal.  Respiratory infections, such as colds, flu, or sinus infections.  Dental problems or teeth clenching.  Fatigue.  Holding your head and neck in the same position for a long period of time, such as while using a computer.  Smoking. SYMPTOMS Symptoms of this condition include:  A feeling of pressure around the head.  Dull, aching head pain.  Pain felt over the front and sides of the head.  Tenderness in the muscles of the head, neck, and shoulders. DIAGNOSIS This condition may be diagnosed based on your symptoms and a physical exam. Tests may be done, such as a CT scan or an MRI of your head. These tests may be done if your symptoms are severe or unusual. TREATMENT This condition may be treated with lifestyle changes and medicines to help relieve symptoms. HOME CARE INSTRUCTIONS Managing Pain  Take over-the-counter and prescription medicines only as told by your health care provider.  Lie down in a dark, quiet room when you have a headache.  If directed, apply ice to the head and neck area:  Put ice in a plastic bag.  Place a towel between your skin and the bag.  Leave the ice on for 20 minutes, 2-3 times per day.  Use a heating pad or a hot shower to apply heat to the head and neck area as told by your health care provider. Eating and Drinking  Eat meals on  a regular schedule.  Limit alcohol use.  Decrease your caffeine intake, or stop using caffeine. General Instructions  Keep all follow-up visits as told by your health care provider. This is important.  Keep a headache journal to help find out what may trigger your headaches. For example, write down:  What you eat and drink.  How much sleep you get.  Any change to your diet or medicines.  Try massage or other relaxation techniques.  Limit stress.  Sit up straight, and avoid tensing your muscles.  Do not use tobacco products, including cigarettes, chewing tobacco, or e-cigarettes. If you need help quitting, ask your health care provider.  Exercise regularly as told by your health care provider.  Get 7-9 hours of sleep, or the amount recommended by your health care provider. SEEK MEDICAL CARE IF:  Your symptoms are not helped by medicine.  You have a headache that is different from what you normally experience.  You have nausea or you vomit.  You have a fever. SEEK IMMEDIATE MEDICAL CARE IF:  Your headache becomes severe.  You have repeated vomiting.  You have a stiff neck.  You have a loss of vision.  You have problems with speech.  You have pain in your eye or ear.  You have muscular weakness or loss of muscle control.  You lose your balance or you have trouble walking.  You feel faint or you pass out.  You have confusion.  This information is not intended to replace advice given to you by your health care provider. Make sure you discuss any questions you have with your health care provider.   Document Released: 08/21/2005 Document Revised: 05/12/2015 Document Reviewed: 12/14/2014 Elsevier Interactive Patient Education 2016 [employer]  Viral Infections Obtain a thermometer to measure your temperature. Continue taking the acetaminophen  for fever and headache Also recommend Claritin or Zyrtec for drainage.  A viral infection can be caused by  different types of viruses.Most viral infections are not serious and resolve on their own. However, some infections may cause severe symptoms and may lead to further complications. SYMPTOMS Viruses can frequently cause:  Minor sore throat.  Aches and pains.  Headaches.  Runny nose.  Different types of rashes.  Watery eyes.  Tiredness.  Cough.  Loss of appetite.  Gastrointestinal infections, resulting in nausea, vomiting, and diarrhea. These symptoms do not respond to antibiotics because the infection is not caused by bacteria. However, you might catch a bacterial infection following the viral infection. This is sometimes called a "superinfection." Symptoms of such a bacterial infection may include:  Worsening sore throat with pus and difficulty swallowing.  Swollen neck glands.  Chills and a high or persistent fever.  Severe headache.  Tenderness over the sinuses.  Persistent overall ill feeling (malaise), muscle aches, and tiredness (fatigue).  Persistent cough.  Yellow, green, or brown mucus production with coughing. HOME CARE INSTRUCTIONS   Only take over-the-counter or prescription medicines for pain, discomfort, diarrhea, or fever as directed by your caregiver.  Drink enough water and fluids to keep your urine clear or pale yellow. Sports drinks can provide valuable electrolytes, sugars, and hydration.  Get plenty of rest and maintain proper nutrition. Soups and broths with crackers or rice are fine. SEEK IMMEDIATE MEDICAL CARE IF:   You have severe headaches, shortness of breath, chest pain, neck pain, or an unusual rash.  You have uncontrolled vomiting, diarrhea, or you are unable to keep down fluids.  You or your child has an oral temperature above 102 F (38.9 C), not controlled by medicine.  Your baby is older than 3 months with a rectal temperature of 102 F (38.9 C) or higher.  Your baby is 68 months old or younger with a rectal temperature of  100.4 F (38 C) or higher. MAKE SURE YOU:   Understand these instructions.  Will watch your condition.  Will get help right away if you are not doing well or get worse.   This information is not intended to replace advice given to you by your health care provider. Make sure you discuss any questions you have with your health care provider.   Document Released: 05/31/2005 Document Revised: 11/13/2011 Document Reviewed: 01/27/2015 Elsevier Interactive Patient Education [employer]

## 2015-07-01 NOTE — ED Provider Notes (Signed)
CSN: [id number]     Arrival date & time 07/01/15  1440 History   First MD Initiated Contact with Patient 07/01/15 1522     Chief Complaint  Patient presents with  . Headache   (Consider location/radiation/quality/duration/timing/severity/associated sxs/prior Treatment) HPI Comments: 34 year old male from [place] the [place] approximately one year ago presents with complaint of headache for 5 days. It is associated with subjective fever, cough and PND. He denies any head trauma, fall or other accident. The pain is in his frontal and bitemporal area. He has been taking acetaminophen over-the-counter which helps with his headaches and fever. His wife states that his fevers do not completely go away with acetaminophen although she has no thermometer to measure that with. Patient is also complaining of tinnitus in the right ear. There is also a decrease in hearing of the right ear. Denies sore throat. Denies vomiting, chest pain or shortness of breath. He states he just feels tired.   History reviewed. No pertinent past medical history. Past Surgical History  Procedure Laterality Date  . Hernia repair  2002   Family History  Problem Relation Age of Onset  . Diabetes Mother   . Hepatitis B Brother     Tested positive at same time as him   Social History  Substance Use Topics  . Smoking status: Never Smoker   . Smokeless tobacco: None  . Alcohol Use: No    Review of Systems  Constitutional: Positive for fever, activity change and fatigue. Negative for diaphoresis.  HENT: Positive for ear pain and postnasal drip. Negative for facial swelling, rhinorrhea, sore throat and trouble swallowing.   Eyes: Negative for pain, discharge and redness.  Respiratory: Positive for cough. Negative for chest tightness, shortness of breath and wheezing.   Cardiovascular: Negative.   Gastrointestinal: Negative.   Musculoskeletal: Negative.  Negative for back pain, neck pain  and neck stiffness.  Skin: Negative for rash.  Neurological: Positive for headaches. Negative for dizziness, tremors, seizures, syncope, speech difficulty and numbness.  All other systems reviewed and are negative.   Allergies  Review of patient's allergies indicates no known allergies.  Home Medications   Prior to Admission medications   Medication Sig Start Date End Date Taking? Authorizing Provider  Chlorhexidine Gluconate 4 % SOLN Wash toes daily 06/25/14   Ozella Rocksavid J Merrell, MD  clotrimazole-betamethasone (LOTRISONE) cream Apply to affected area 2 times daily 10/19/14   Graylon GoodZachary H Baker, PA-C  cyclobenzaprine (FLEXERIL) 10 MG tablet Take 1 tablet (10 mg total) by mouth 2 (two) times daily as needed for muscle spasms. 10/19/14   Graylon GoodZachary H Baker, PA-C  terbinafine (LAMISIL) 250 MG tablet Take 1 tablet (250 mg total) by mouth daily. 10/19/14   Graylon GoodZachary H Baker, PA-C   Meds Ordered and Administered this Visit  Medications - No data to display  BP 111/70 mmHg  Pulse 82  Temp(Src) 99.8 F (37.7 C) (Oral)  Resp 24  SpO2 97% No data found.   Physical Exam  Constitutional: He is oriented to person, place, and time. He appears well-developed and well-nourished. No distress.  HENT:  Head: Normocephalic and atraumatic.  Left TM retracted. No erythema. Right TM partially scared with cerumen. The anterior portion is pale and smooth and mildly injected. Oropharynx very difficult to examine. The patient maintains a tenable retraction of the tongue preventing observation. At one point he gagged and I was able to obtain a quick clamps. There was no swelling or exudates.  There was mild injection to the posterior pharynx.  Eyes: Conjunctivae and EOM are normal.  Neck: Normal range of motion. Neck supple.  Cardiovascular: Normal rate, regular rhythm and normal heart sounds.   Pulmonary/Chest: Effort normal and breath sounds normal. No respiratory distress. He has no wheezes. He has no rales.   Musculoskeletal: Normal range of motion. He exhibits no edema.  Lymphadenopathy:    He has no cervical adenopathy.  Neurological: He is alert and oriented to person, place, and time. He has normal strength. He displays no tremor. No cranial nerve deficit or sensory deficit. He exhibits normal muscle tone. Coordination and gait normal.  Skin: Skin is warm and dry. No rash noted. He is not diaphoretic.  Psychiatric: He has a normal mood and affect.  Nursing note and vitals reviewed.   ED Course  Procedures (including critical care time)  Labs Review Labs Reviewed - No data to display  Imaging Review No results found.   Visual Acuity Review  Right Eye Distance:   Left Eye Distance:   Bilateral Distance:    Right Eye Near:   Left Eye Near:    Bilateral Near:         MDM   1. Viral illness   2. Episodic tension-type headache, not intractable   3. PND (post-nasal drip)     Suspect the patient has a viral syndrome. Since he has tenderness in the bitemporal area he may also have tension headache in addition to that which is often associated with viral syndromes.. His neurologic exam is completely normal. He states his OTC medicines do help with his symptoms. We will therefore asked him to continue that medication. Obtain a thermometer to measure your temperature. Continue taking the acetaminophen  for fever and headache Also recommend Claritin or Zyrtec for drainage.     Hayden Rasmussen, NP 07/01/15 1600

## 2015-07-01 NOTE — ED Notes (Signed)
C/o headache, fever, and cough.  Patient reports cough with clear phlegm.  Pain for 4 days.  Denies sore throat, denies ear pain.

## 2015-07-06 ENCOUNTER — Ambulatory Visit: Payer: Medicaid Other | Admitting: Family Medicine

## 2015-10-21 ENCOUNTER — Other Ambulatory Visit (HOSPITAL_COMMUNITY)
Admission: RE | Admit: 2015-10-21 | Discharge: 2015-10-21 | Disposition: A | Discharge status: Home / Self Care | Payer: Medicaid Other | Source: Ambulatory Visit | Attending: Family Medicine | Admitting: Family Medicine

## 2015-10-21 ENCOUNTER — Encounter: Payer: Self-pay | Admitting: Family Medicine

## 2015-10-21 ENCOUNTER — Ambulatory Visit (INDEPENDENT_AMBULATORY_CARE_PROVIDER_SITE_OTHER): Payer: Medicaid Other | Admitting: Family Medicine

## 2015-10-21 VITALS — BP 117/68 | HR 57 | Temp 98.3°F | Wt 214.0 lb

## 2015-10-21 DIAGNOSIS — L918 Other hypertrophic disorders of the skin: Secondary | ICD-10-CM

## 2015-10-21 DIAGNOSIS — Z113 Encounter for screening for infections with a predominantly sexual mode of transmission: Secondary | ICD-10-CM | POA: Insufficient documentation

## 2015-10-21 DIAGNOSIS — R300 Dysuria: Secondary | ICD-10-CM

## 2015-10-21 DIAGNOSIS — R309 Painful micturition, unspecified: Secondary | ICD-10-CM

## 2015-10-21 LAB — POCT URINALYSIS DIPSTICK
Bilirubin, UA: NEGATIVE
Blood, UA: NEGATIVE
GLUCOSE UA: NEGATIVE
KETONES UA: NEGATIVE
LEUKOCYTES UA: NEGATIVE
Nitrite, UA: NEGATIVE
PH UA: 7
Protein, UA: NEGATIVE
Spec Grav, UA: 1.02
Urobilinogen, UA: 1

## 2015-10-21 MED ORDER — AZITHROMYCIN 500 MG PO TABS
1000.0000 mg | ORAL_TABLET | Freq: Once | ORAL | Status: AC
  Administered 2015-10-21: 1000 mg via ORAL

## 2015-10-21 MED ORDER — CEFTRIAXONE SODIUM 1 G IJ SOLR
250.0000 mg | Freq: Once | INTRAMUSCULAR | Status: AC
  Administered 2015-10-21: 250 mg via INTRAMUSCULAR

## 2015-10-21 NOTE — Assessment & Plan Note (Signed)
Dysuria with reportedly urethral discharge.  - treat with ceftriaxone 250 IM and azithromycin 1 g by mouth - Urine GC chlamydia and UA obtained; I'll notify of results

## 2015-10-21 NOTE — Patient Instructions (Addendum)
I have order some labs today to check dysuria. I will send you a letter with the results, or call you if we need to make any changes to your current therapies.   - Please call infectious disease at [phone], and schedule follow-up appointment for your hepatitis B.  Please discuss the blood work and ultrasound that was ordered at her previous visit, as you will likely need to have this treated prior to your follow-up appointment with infectious disease.

## 2015-10-21 NOTE — Progress Notes (Signed)
   Subjective:    Patient ID: Jeffrey Lowe, male    DOB: 29-Nov-1980, 35 y.o.   MRN: [id number]  Seen for Same day visit for   CC: Skin lesion and dysuria  Dysuria He reports burning pain with urination for several weeks - mild urethral discharge  - Denies back pain - Denies any genital lesions - Denies history of STDs - No fevers - He is in a monogamous relationship with his wife  Skin lesion - He reports painful skin lesion on his inner right thigh - Lesion has been present for years without changes in size or color; however, recently has become painful and irritating with closing changes and walking  - Past medical history: History of chronic hepatitis B, followed up with infectious disease  - smoking history noted Objective:  BP 117/68 mmHg  Pulse 57  Temp(Src) 98.3 F (36.8 C) (Oral)  Wt 214 lb (97.07 kg)  General: NAD Skin: Skin tag noted on right anterior thigh    Assessment & Plan:   Dysuria Dysuria with reportedly urethral discharge.  - treat with ceftriaxone 250 IM and azithromycin 1 g by mouth - Urine GC chlamydia and UA obtained; I'll notify of results   Skin tag - Removed in clinic with no complications - Sent to pathology

## 2015-10-22 LAB — URINE CYTOLOGY ANCILLARY ONLY
Chlamydia: NEGATIVE
Neisseria Gonorrhea: NEGATIVE

## 2015-10-25 ENCOUNTER — Encounter: Payer: Self-pay | Admitting: Family Medicine

## 2015-12-14 ENCOUNTER — Telehealth: Payer: Self-pay | Admitting: *Deleted

## 2015-12-14 DIAGNOSIS — B1910 Unspecified viral hepatitis B without hepatic coma: Secondary | ICD-10-CM

## 2015-12-14 NOTE — Telephone Encounter (Signed)
Patient called and advised he was to have an Ultrasound before he sees Dr Orvan Falconerampbell 02/01/16. Advised he has not heard anything as of yet. Reminded him he arrived late 05/2015 and he advised it was to be rescheduled. Advised him will let the nurse know and someone will call him soon.

## 2015-12-15 ENCOUNTER — Ambulatory Visit (INDEPENDENT_AMBULATORY_CARE_PROVIDER_SITE_OTHER): Payer: Medicaid Other | Admitting: Family Medicine

## 2015-12-15 ENCOUNTER — Encounter: Payer: Self-pay | Admitting: Family Medicine

## 2015-12-15 VITALS — BP 115/70 | HR 61 | Temp 98.2°F | Wt 222.1 lb

## 2015-12-15 DIAGNOSIS — K1379 Other lesions of oral mucosa: Secondary | ICD-10-CM | POA: Insufficient documentation

## 2015-12-15 MED ORDER — MAGIC MOUTHWASH W/LIDOCAINE: 5.0000 mL | Freq: Three times a day (TID) | ORAL | Status: AC | PRN

## 2015-12-15 MED ORDER — LIDOCAINE VISCOUS 2 % MT SOLN: 20.0000 mL | OROMUCOSAL | Status: AC | PRN

## 2015-12-15 NOTE — Progress Notes (Signed)
   Subjective:    Patient ID: Jeffrey Lowe, male    DOB: 1980-09-16, 35 y.o.   MRN: [id number]  Seen for Same day visit for   CC: mouth ulcers  He reports 2 weeks of mouth ulcers.  Reports ulcers are extremely painful with burning sensation, making it difficult for him to eat.  He denies any fevers, chills, genital ulcers or lesions, history of HIV.  Is followed by infectious disease for hepatitis B.  Reports of these lesions have been coming and going monthly for the past year.  He denies any aggravating or relieving factors.  Denies any new medications.   Smoking status noted Review of Systems   See HPI for ROS. Objective:  BP 115/70 mmHg  Pulse 61  Temp(Src) 98.2 F (36.8 C) (Oral)  Wt 222 lb 1.6 oz (100.744 kg)  General: NAD Cardiac: RRR, normal heart sounds, no murmurs. 2+ radial and PT pulses bilaterally Respiratory: CTAB, normal effort Abdomen: soft, nontender, nondistended, no hepatic or splenomegaly. Bowel sounds present Skin: warm and dry, no rashes noted; no ulcers or lesions noted on hands Neuro: alert and oriented, no focal deficits     Assessment & Plan:   Recurrent oral ulcers Multiple shallow oral mucosal with erythematous ring surrounding.  Intermittent self resolving episodes occurring monthly for the past year.  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  Denies genital lesions; operative recent episode of dysuria (negative for gonorrhea, chlamydia) that resolved with antibiotics.  Less likely HIV (in monogamous relationship with recent negative HIV test) - Check HSV antibodies - Viral culture of ulcers - Treat with topical analgesics (Magic mouthwash, Viscous Lidocaine) - We'll call with results and next steps

## 2015-12-15 NOTE — Patient Instructions (Signed)
We will do blood work and culture to try and determine what is causing your ulcers.  - Meantime, you can use Magic mouthwash or liquid lidocaine swishes to help with the pain - I will call you with the results of your tests

## 2015-12-15 NOTE — Assessment & Plan Note (Signed)
Multiple shallow oral mucosal with erythematous ring surrounding.  Intermittent self resolving episodes occurring monthly for the past year.  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  Denies genital lesions; operative recent episode of dysuria (negative for gonorrhea, chlamydia) that resolved with antibiotics.  Less likely HIV (in monogamous relationship with recent negative HIV test) - Check HSV antibodies - Viral culture of ulcers - Treat with topical analgesics (Magic mouthwash, Viscous Lidocaine) - We'll call with results and next steps

## 2015-12-16 LAB — HSV(HERPES SIMPLEX VRS) I + II AB-IGG
HSV 1 Glycoprotein G Ab, IgG: >58 Index — ABNORMAL HIGH (ref <0.90)
HSV 2 Glycoprotein G Ab, IgG: <0.9 Index (ref <0.90)

## 2015-12-17 LAB — HERPES SIMPLEX VIRUS CULTURE: ORGANISM ID, BACTERIA: NOT DETECTED

## 2015-12-21 NOTE — Telephone Encounter (Signed)
Order and authorization for abdominal ultrasound / elastography has expired, will need new order.  Pt had an appt for this test on 06/03/15 which the patient cancelled without rescheduling.  Dr. Orvan Falconerampbell, OK to reorder and obtain authorization?  Pt has a return appointment on 02/01/16.

## 2015-12-21 NOTE — Telephone Encounter (Signed)
Yes, please reorder it.

## 2015-12-22 NOTE — Telephone Encounter (Signed)
Obtained [place] authorization from pt's PCP office.  Notified pt to call Radiology Scheduling to make his own appt.

## 2015-12-22 NOTE — Addendum Note (Signed)
Addended by: Jennet MaduroESTRIDGE, DENISE D on: 12/22/2015 12:51 PM   Modules accepted: Orders

## 2015-12-23 ENCOUNTER — Other Ambulatory Visit: Payer: Medicaid Other

## 2016-02-01 ENCOUNTER — Telehealth: Payer: Self-pay | Admitting: *Deleted

## 2016-02-01 ENCOUNTER — Ambulatory Visit: Payer: Medicaid Other | Admitting: Internal Medicine

## 2016-02-01 NOTE — Telephone Encounter (Signed)
Unable to leave message.  Voice mail not set up.

## 2017-01-23 ENCOUNTER — Telehealth: Payer: Self-pay | Admitting: Family Medicine

## 2017-01-23 DIAGNOSIS — M79676 Pain in unspecified toe(s): Secondary | ICD-10-CM

## 2017-01-23 NOTE — Telephone Encounter (Signed)
Are you able to fax this over today?  Thanks, JW

## 2017-01-23 NOTE — Telephone Encounter (Signed)
Done

## 2017-01-23 NOTE — Telephone Encounter (Signed)
Pt states he needs [facility], appointment is tomorrow at 9am. Pt was hard to understand, but he needs the referral because he cut his toenails and they hurt. [facility] needs the referral by the end of the day. ep

## 2017-03-22 ENCOUNTER — Ambulatory Visit: Payer: Medicaid Other | Admitting: Internal Medicine

## 2017-04-20 ENCOUNTER — Ambulatory Visit: Payer: Medicaid Other | Admitting: Family Medicine

## 2018-11-20 ENCOUNTER — Telehealth: Payer: Self-pay | Admitting: Family Medicine

## 2018-11-20 NOTE — Telephone Encounter (Signed)
Pt is calling to ask for a doctors note back to work. He was not seen in our clinic due to sickness. He said that he feels completely fine now and didn't feel like he would need to be seen in the office. The patient can not return to work without a doctors note and would like to know if Dr. Gwendolyn Grant could write a note.   Please call patient and let him know if this is possible. The best call back number is [phone]

## 2018-11-20 NOTE — Telephone Encounter (Signed)
Called and spoke with patient.  He has a "pimple" (sounds like small abscess) under his arm yesterday and it was paining him to lift his arm.  He went home due to this, as he could not finish working.  It is unclear, but it sounds like it either expressed itself overnight or resolved.  Either way, he would like to return to work today but needs a doctor's note.   He's had not URI/fever/SOB symptoms.  I will write a note that he can return and leave it up front for him to pick up.

## 2019-01-09 ENCOUNTER — Telehealth (INDEPENDENT_AMBULATORY_CARE_PROVIDER_SITE_OTHER): Payer: HRSA Program | Admitting: Family Medicine

## 2019-01-09 ENCOUNTER — Other Ambulatory Visit: Payer: Self-pay

## 2019-01-09 ENCOUNTER — Encounter: Payer: Self-pay | Admitting: Family Medicine

## 2019-01-09 ENCOUNTER — Telehealth: Payer: Self-pay | Admitting: Family Medicine

## 2019-01-09 DIAGNOSIS — Z20828 Contact with and (suspected) exposure to other viral communicable diseases: Secondary | ICD-10-CM

## 2019-01-09 DIAGNOSIS — Z20822 Contact with and (suspected) exposure to covid-19: Secondary | ICD-10-CM | POA: Insufficient documentation

## 2019-01-09 NOTE — Assessment & Plan Note (Signed)
Plus symptom of loss of smell.  Needs testing before return to work.

## 2019-01-09 NOTE — Progress Notes (Signed)
Agrees to video visit. Quarenteened since Saturday April 24th.  Brother visited [place].  Brother tested positive after he returned to [employer].  Patient has the symptom of loss of smell.   Denies fever, cough chills, shortness of breath.  He has self quarenteened - but his household family members have been exposed to brother and him.    Feels fine and wants a note to go back to work.   Needs COVID testing.  Given information to call health dept and schedule testing appointment.  Those interested must schedule an appointment in advance by calling [phone].  Duration of call was 15 minutes.

## 2019-01-14 ENCOUNTER — Encounter: Payer: Self-pay | Admitting: Family Medicine

## 2019-01-14 ENCOUNTER — Telehealth (INDEPENDENT_AMBULATORY_CARE_PROVIDER_SITE_OTHER): Payer: HRSA Program | Admitting: Family Medicine

## 2019-01-14 ENCOUNTER — Other Ambulatory Visit: Payer: Self-pay

## 2019-01-14 DIAGNOSIS — Z20828 Contact with and (suspected) exposure to other viral communicable diseases: Secondary | ICD-10-CM | POA: Diagnosis not present

## 2019-01-14 DIAGNOSIS — Z20822 Contact with and (suspected) exposure to covid-19: Secondary | ICD-10-CM

## 2019-01-14 NOTE — Progress Notes (Signed)
[facility] Telemedicine Visit  Patient consented to have virtual visit. Method of visit: Video  Encounter participants: Patient: Jeffrey Lowe - located at Home Provider: Janit Pagan - located at Office Others (if applicable): NA  Chief Complaint: Follow- COVID-19 Esposure  HPI:  Follow-up call regarding COVID-19 exposure. Last week he had a fever; he lost his sense of smell and taste. He endorsed an associated mild SOB. He was referred to the health department for COVID-19 testing since he has exposure hx. Per the patient, his result came back positive, as well as his wife. He contacted the HD, who recommended that he self-quarantine till next Monday. He currently denies any symptoms. He feels well in general. His work is requesting a copy of his positive result to allow him to stay home during his quarantine period.   ROS: per HPI  Pertinent PMHx: Problem list reviewed.  Exam:  Respiratory: Not in distress. HEENT: EOMI. Neuro: Awake and alert  Assessment/Plan:  Close Exposure to Covid-19 Virus Asymptomatic. Per the patient, he has positive COVID-19 result. I reviewed his record and I don't see a copy of his report. I advised him to contact the health department for his test result. Discussed self quarantine as recommended by the HD. Return precaution discussed.  NB: He asked to get his children tested. Kids are currently asymptomatic. I advised him to contact their pediatrician's office to order test. He stated that they don't come to our clinic for pediatric care. ED precaution discussed should they develop any symptoms. He agreed with the plan.    Time spent during visit with patient: 20 minutes

## 2019-01-14 NOTE — Assessment & Plan Note (Signed)
Asymptomatic. Per the patient, he has positive COVID-19 result. I reviewed his record and I don't see a copy of his report. I advised him to contact the health department for his test result. Discussed self quarantine as recommended by the HD. Return precaution discussed.  NB: He asked to get his children tested. Kids are currently asymptomatic. I advised him to contact their pediatrician's office to order test. He stated that they don't come to our clinic for pediatric care. ED precaution discussed should they develop any symptoms. He agreed with the plan.

## 2019-01-23 ENCOUNTER — Other Ambulatory Visit: Payer: Self-pay

## 2019-01-23 ENCOUNTER — Telehealth: Payer: Self-pay | Admitting: *Deleted

## 2019-01-23 ENCOUNTER — Telehealth: Payer: Self-pay

## 2019-01-23 ENCOUNTER — Telehealth (INDEPENDENT_AMBULATORY_CARE_PROVIDER_SITE_OTHER): Payer: HRSA Program | Admitting: Family Medicine

## 2019-01-23 DIAGNOSIS — Z20822 Contact with and (suspected) exposure to covid-19: Secondary | ICD-10-CM

## 2019-01-23 DIAGNOSIS — U071 COVID-19: Secondary | ICD-10-CM

## 2019-01-23 NOTE — Telephone Encounter (Signed)
-----   Message from Joana Reamer, DO sent at 01/23/2019  2:54 PM EDT ----- COVID Drive-Up Test Referral Criteria  Patient age: 38 y.o.  Symptoms: No Symptoms. Tested positive for COVID. Currently asymptomatic. Needs test of cure to return to work.  Underlying Conditions: No underlying conditions  Is the patient a first responder? No  Does the patient live or work in a high risk or high density environment: No. Prior exposure with previous prior exposure. Self quarantined.   Is the patient a COVID convalescent patient who is 14-28 days symptom-free and interested in donating plasma for use as a therapeutic product? No    Please make appointment as soon as possible so patient can return to work before he loses his job. Thank you.

## 2019-01-23 NOTE — Assessment & Plan Note (Addendum)
Currently asymptomatic. Cleared by health department. Will send message to [facility] testing center for patient to be schedule for COVID test so he can go back to work.

## 2019-01-23 NOTE — Telephone Encounter (Signed)
-----   Message from Kiersten P Mullis, DO sent at 01/23/2019  2:54 PM EDT ----- COVID Drive-Up Test Referral Criteria  Patient age: 37 y.o.  Symptoms: No Symptoms. Tested positive for COVID. Currently asymptomatic. Needs test of cure to return to work.  Underlying Conditions: No underlying conditions  Is the patient a first responder? No  Does the patient live or work in a high risk or high density environment: No. Prior exposure with previous prior exposure. Self quarantined.   Is the patient a COVID convalescent patient who is 14-28 days symptom-free and interested in donating plasma for use as a therapeutic product? No    Please make appointment as soon as possible so patient can return to work before he loses his job. Thank you.  

## 2019-01-23 NOTE — Telephone Encounter (Signed)
I opened chart to call pt for testing for the COVID-19 virus however I received another phone call before I could call him.   My co-worker Ledon Snare, RN called and scheduled him.

## 2019-01-23 NOTE — Progress Notes (Signed)
[facility] Telemedicine Visit  Patient consented to have virtual visit. Method of visit: Telephone  Encounter participants: Patient: Jeffrey Lowe - located at home Provider: Joana Reamer - located at [facility] Others (if applicable): None  Chief Complaint: Recent COVID positive  HPI: Patient notes he and his wife  tested positive for COVID on May 7th. He has been self quarantined since then. Health department called patient and stated he could go back to work starting today. They are sending patient a Return to Work letter. However work noted he needed a negative COVID test.   ROS: per HPI  Pertinent PMHx: COVID positive   Exam:  Respiratory: talking in full sentences. No respiratory distress.   Assessment/Plan:  COVID-19 virus infection Currently asymptomatic. Cleared by health department. Will send message to [facility] testing center for patient to be schedule for COVID test so he can go back to work.    Time spent during visit with patient: 20 minutes  Orpah Cobb, DO [facility], PGY1 01/23/19

## 2019-01-23 NOTE — Telephone Encounter (Signed)
Dr. Orpah Cobb requesting COVID 19 TESTING.

## 2019-01-24 ENCOUNTER — Other Ambulatory Visit: Payer: Medicaid Other

## 2019-01-24 DIAGNOSIS — Z20822 Contact with and (suspected) exposure to covid-19: Secondary | ICD-10-CM

## 2019-01-27 LAB — NOVEL CORONAVIRUS, NAA: SARS-CoV-2, NAA: NOT DETECTED

## 2019-01-28 ENCOUNTER — Telehealth: Payer: Self-pay | Admitting: Family Medicine

## 2019-01-28 ENCOUNTER — Encounter: Payer: Self-pay | Admitting: *Deleted

## 2019-01-28 NOTE — Telephone Encounter (Signed)
Pt calling for his COVID test results from last week. Please call pt back with these results.

## 2019-01-28 NOTE — Telephone Encounter (Signed)
Patient seen by Dr. Mauri Reading.  Please call patient and let him know that Covid testing was negative.  If he needs note for work, please provide one as well.  Let me know if questions.  Thanks!

## 2019-01-28 NOTE — Telephone Encounter (Signed)
Patient did not like that we had to open his letter to be picked up in order to verify who he is/was, as per hipaa.  That is the protocol given to me by Lattie Corns and I informed patient it must be done that way.  He says he will be calling you to complain.  So, just fyi.

## 2019-01-28 NOTE — Telephone Encounter (Signed)
Pt called back and informed him of below.  Letter created and placed up front for pt to pick up. April Zimmerman Rumple, CMA

## 2019-01-29 NOTE — Telephone Encounter (Signed)
Ok I can talk with him.  I didn't know this was a policy.

## 2019-04-28 ENCOUNTER — Encounter: Payer: Medicaid Other | Attending: Physician Assistant | Admitting: Physician Assistant

## 2019-04-28 ENCOUNTER — Ambulatory Visit (INDEPENDENT_AMBULATORY_CARE_PROVIDER_SITE_OTHER): Payer: Self-pay | Admitting: Family Medicine

## 2019-04-28 ENCOUNTER — Other Ambulatory Visit: Payer: Self-pay

## 2019-04-28 VITALS — BP 104/58 | HR 69 | Ht 66.0 in | Wt 228.1 lb

## 2019-04-28 DIAGNOSIS — L98499 Non-pressure chronic ulcer of skin of other sites with unspecified severity: Secondary | ICD-10-CM | POA: Insufficient documentation

## 2019-04-28 DIAGNOSIS — Z8249 Family history of ischemic heart disease and other diseases of the circulatory system: Secondary | ICD-10-CM | POA: Insufficient documentation

## 2019-04-28 DIAGNOSIS — T300 Burn of unspecified body region, unspecified degree: Secondary | ICD-10-CM | POA: Insufficient documentation
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
## 2019-05-02 NOTE — Progress Notes (Addendum)
DENIRO, LAYMON ([id number]) Visit Report for 04/28/2019 Allergy List Details Patient Name: Jeffrey Lowe, Jeffrey Lowe Date of Service: 04/28/2019 12:30 PM Medical Record Number: [id number] Patient Account Number: [id number] Date of Birth/Sex: 1980/11/27 (38 y.o. M) Treating RN: Cornell Barman Primary Care Courage Biglow: Andrena Mews Other Clinician: Referring Saraiyah Hemminger: Sherene Sires Treating Garyn Waguespack/Extender: Melburn Hake, HOYT Weeks in Treatment: 0 Allergies Active Allergies No Known Drug Allergies Allergy Notes Electronic Signature(s) Signed: 05/02/2019 9:32:15 AM By: Gretta Cool, BSN, RN, CWS, Kim RN, BSN Entered By: Gretta Cool, BSN, RN, CWS, Kim on 04/28/2019 12:44:57 Jeffrey Lowe ([id number]) -------------------------------------------------------------------------------- Arrival Information Details Patient Name: Jeffrey Lowe Date of Service: 04/28/2019 12:30 PM Medical Record Number: [id number] Patient Account Number: [id number] Date of Birth/Sex: [birth year] (38 y.o. M) Treating RN: Cornell Barman Primary Care Pina Sirianni: Andrena Mews Other Clinician: Referring Samya Siciliano: Sherene Sires Treating Cataleia Gade/Extender: Melburn Hake, HOYT Weeks in Treatment: 0 Visit Information Patient Arrived: Ambulatory Arrival Time: 12:37 Accompanied By: wife Transfer Assistance: None Patient Identification Verified: Yes Secondary Verification Process Completed: Yes Patient Requires Transmission-Based No Precautions: Patient Has Alerts: No Electronic Signature(s) Signed: 05/02/2019 9:32:15 AM By: Gretta Cool, BSN, RN, CWS, Kim RN, BSN Entered By: Gretta Cool, BSN, RN, CWS, Kim on 04/28/2019 12:38:12 Jeffrey Lowe ([id number]) -------------------------------------------------------------------------------- Clinic Level of Care Assessment Details Patient Name: Jeffrey Lowe Date of Service: 04/28/2019 12:30 PM Medical Record Number: [id number] Patient Account Number: [id number] Date of Birth/Sex: [birth year] (38 y.o. M) Treating  RN: Harold Barban Primary Care Wood Novacek: Andrena Mews Other Clinician: Referring Oneill Bais: Sherene Sires Treating Kyung Muto/Extender: Melburn Hake, HOYT Weeks in Treatment: 0 Clinic Level of Care Assessment Items TOOL 2 Quantity Score []  - Use when only an EandM is performed on the INITIAL visit 0 ASSESSMENTS - Nursing Assessment / Reassessment X - General Physical Exam (combine w/ comprehensive assessment (listed just below) when 1 20 performed on new pt. evals) X- 1 25 Comprehensive Assessment (HX, ROS, Risk Assessments, Wounds Hx, etc.) ASSESSMENTS - Wound and Skin Assessment / Reassessment []  - Simple Wound Assessment / Reassessment - one wound 0 X- 3 5 Complex Wound Assessment / Reassessment - multiple wounds []  - 0 Dermatologic / Skin Assessment (not related to wound area) ASSESSMENTS - Ostomy and/or Continence Assessment and Care []  - Incontinence Assessment and Management 0 []  - 0 Ostomy Care Assessment and Management (repouching, etc.) PROCESS - Coordination of Care X - Simple Patient / Family Education for ongoing care 1 15 []  - 0 Complex (extensive) Patient / Family Education for ongoing care []  - 0 Staff obtains Programmer, systems, Records, Test Results / Process Orders []  - 0 Staff telephones HHA, Nursing Homes / Clarify orders / etc []  - 0 Routine Transfer to another Facility (non-emergent condition) []  - 0 Routine Hospital Admission (non-emergent condition) []  - 0 New Admissions / Biomedical engineer / Ordering NPWT, Apligraf, etc. []  - 0 Emergency Hospital Admission (emergent condition) X- 1 10 Simple Discharge Coordination []  - 0 Complex (extensive) Discharge Coordination PROCESS - Special Needs []  - Pediatric / Minor Patient Management 0 []  - 0 Isolation Patient Management Jeffrey Lowe, Jeffrey Lowe ([id number]) []  - 0 Hearing / Language / Visual special needs []  - 0 Assessment of Community assistance (transportation, D/C planning, etc.) []  - 0 Additional  assistance / Altered mentation []  - 0 Support Surface(s) Assessment (bed, cushion, seat, etc.) INTERVENTIONS - Wound Cleansing / Measurement X - Wound Imaging (photographs - any number of wounds) 1 5 []  - 0 Wound Tracing (instead of photographs) []  - 0 Simple Wound Measurement - one wound X- 3 5 Complex Wound  Measurement - multiple wounds []  - 0 Simple Wound Cleansing - one wound X- 3 5 Complex Wound Cleansing - multiple wounds INTERVENTIONS - Wound Dressings []  - Small Wound Dressing one or multiple wounds 0 X- 3 15 Medium Wound Dressing one or multiple wounds []  - 0 Large Wound Dressing one or multiple wounds []  - 0 Application of Medications - injection INTERVENTIONS - Miscellaneous []  - External ear exam 0 []  - 0 Specimen Collection (cultures, biopsies, blood, body fluids, etc.) []  - 0 Specimen(s) / Culture(s) sent or taken to Lab for analysis []  - 0 Patient Transfer (multiple staff / Nurse, adult / Similar devices) []  - 0 Simple Staple / Suture removal (25 or less) []  - 0 Complex Staple / Suture removal (26 or more) []  - 0 Hypo / Hyperglycemic Management (close monitor of Blood Glucose) []  - 0 Ankle / Brachial Index (ABI) - do not check if billed separately Has the patient been seen at the hospital within the last three years: Yes Total Score: 165 Level Of Care: New/Established - Level 5 Electronic Signature(s) Signed: 04/28/2019 4:17:37 PM By: Arnette Norris Entered By: Arnette Norris on 04/28/2019 13:13:14 Jeffrey Lowe ([id number]) -------------------------------------------------------------------------------- Encounter Discharge Information Details Patient Name: Jeffrey Lowe Date of Service: 04/28/2019 12:30 PM Medical Record Number: [id number] Patient Account Number: [id number] Date of Birth/Sex: Dec 30, 1980 (38 y.o. M) Treating RN: Rodell Perna Primary Care Giavanna Kang: Janit Pagan Other Clinician: Referring Geremiah Fussell: Marthenia Rolling Treating  Hajar Penninger/Extender: Linwood Dibbles, HOYT Weeks in Treatment: 0 Encounter Discharge Information Items Discharge Condition: Stable Ambulatory Status: Ambulatory Discharge Destination: Home Transportation: Private Auto Accompanied By: wife Schedule Follow-up Appointment: Yes Clinical Summary of Care: Electronic Signature(s) Signed: 04/28/2019 4:21:01 PM By: Rodell Perna Entered By: Rodell Perna on 04/28/2019 13:34:10 Jeffrey Lowe ([id number]) -------------------------------------------------------------------------------- Lower Extremity Assessment Details Patient Name: Jeffrey Lowe Date of Service: 04/28/2019 12:30 PM Medical Record Number: [id number] Patient Account Number: [id number] Date of Birth/Sex: 25-Jan-1981 (38 y.o. M) Treating RN: Huel Coventry Primary Care Faithe Ariola: Janit Pagan Other Clinician: Referring Carr Shartzer: Marthenia Rolling Treating Shayleen Eppinger/Extender: Linwood Dibbles, HOYT Weeks in Treatment: 0 Edema Assessment Assessed: [Left: No] [Right: No] Edema: [Left: N] [Right: o] Vascular Assessment Pulses: Dorsalis Pedis Palpable: [Right:Yes] Posterior Tibial Palpable: [Right:Yes] Electronic Signature(s) Signed: 05/02/2019 9:32:15 AM By: Elliot Gurney, BSN, RN, CWS, Kim RN, BSN Entered By: Elliot Gurney, BSN, RN, CWS, Kim on 04/28/2019 12:58:56 Jeffrey Lowe ([id number]) -------------------------------------------------------------------------------- Multi Wound Chart Details Patient Name: Jeffrey Lowe Date of Service: 04/28/2019 12:30 PM Medical Record Number: [id number] Patient Account Number: [id number] Date of Birth/Sex: 01-07-1981 (38 y.o. M) Treating RN: Arnette Norris Primary Care Greig Altergott: Janit Pagan Other Clinician: Referring Micharl Helmes: Marthenia Rolling Treating Shene Maxfield/Extender: Linwood Dibbles, HOYT Weeks in Treatment: 0 Vital Signs Height(in): 66 Pulse(bpm): 59 Weight(lbs): 230 Blood Pressure(mmHg): 135/71 Body Mass Index(BMI): 37 Temperature(F): 97.9 Respiratory  Rate 16 (breaths/min): Photos: Wound Location: Right Upper Leg - Proximal Left Upper Leg - Medial Left Upper Leg - Distal Wounding Event: Blister Bump Thermal Burn Primary Etiology: 2nd degree Burn 2nd degree Burn 2nd degree Burn Date Acquired: 04/25/2019 04/25/2019 04/25/2019 Weeks of Treatment: 0 0 0 Wound Status: Open Open Open Clustered Wound: No No Yes Clustered Quantity: N/A N/A 2 Measurements L x W x D 17x6.2x0.1 1.2x1.2x0.1 1.2x3x0.1 (cm) Area (cm) : 82.781 1.131 2.827 Volume (cm) : 8.278 0.113 0.283 % Reduction in Area: 0.00% 0.00% 0.00% % Reduction in Volume: 0.00% 0.00% 0.00% Classification: Full Thickness Without Full Thickness Without Full Thickness Without Exposed Support Structures Exposed Support Structures Exposed Support Structures Exudate  Amount: Medium Medium Medium Exudate Type: Serous Serous Serous Exudate Color: amber amber amber Wound Margin: Flat and Intact Flat and Intact Flat and Intact Granulation Amount: Medium (34-66%) Medium (34-66%) None Present (0%) Granulation Quality: Red Red N/A Necrotic Amount: Medium (34-66%) Medium (34-66%) Large (67-100%) Necrotic Tissue: Adherent Slough Adherent Slough Eschar, Adherent Slough Exposed Structures: Fat Layer (Subcutaneous Fat Layer (Subcutaneous Fat Layer (Subcutaneous Tissue) Exposed: Yes Tissue) Exposed: Yes Tissue) Exposed: Yes Fascia: No Fascia: No Fascia: No Tendon: No Tendon: No Tendon: No Muscle: No Muscle: No Muscle: No Stawicki, Jeffrey Lowe ([id number]) Joint: No Joint: No Joint: No Bone: No Bone: No Bone: No Epithelialization: None Small (1-33%) None Treatment Notes Electronic Signature(s) Signed: 04/28/2019 4:17:37 PM By: Arnette NorrisBiell, Kristina Entered By: Arnette NorrisBiell, Kristina on 04/28/2019 13:11:33 Jeffrey DillingMUSHUNJU, Jeffrey Lowe ([id number]) -------------------------------------------------------------------------------- Multi-Disciplinary Care Plan Details Patient Name: Jeffrey DillingMUSHUNJU, Jeffrey Lowe Date of Service:  04/28/2019 12:30 PM Medical Record Number: [id number] Patient Account Number: [id number] Date of Birth/Sex: 1981-04-18 (38 y.o. M) Treating RN: Arnette NorrisBiell, Kristina Primary Care Liyah Higham: Janit PaganENIOLA, KEHINDE Other Clinician: Referring Natalina Wieting: Marthenia RollingBLAND, SCOTT Treating Teran Knittle/Extender: Linwood DibblesSTONE III, HOYT Weeks in Treatment: 0 Active Inactive Electronic Signature(s) Signed: 05/20/2019 4:18:35 PM By: Elliot GurneyWoody, BSN, RN, CWS, Kim RN, BSN Signed: 07/22/2019 4:03:12 PM By: Arnette NorrisBiell, Kristina Previous Signature: 04/28/2019 4:17:37 PM Version By: Arnette NorrisBiell, Kristina Entered By: Elliot GurneyWoody, BSN, RN, CWS, Kim on 05/20/2019 16:18:34 Jeffrey DillingMUSHUNJU, Jeffrey Lowe ([id number]) -------------------------------------------------------------------------------- Pain Assessment Details Patient Name: Jeffrey DillingMUSHUNJU, Hyland Date of Service: 04/28/2019 12:30 PM Medical Record Number: [id number] Patient Account Number: [id number] Date of Birth/Sex: 1981-04-18 (38 y.o. M) Treating RN: Huel CoventryWoody, Kim Primary Care Akshith Moncus: Janit PaganENIOLA, KEHINDE Other Clinician: Referring Shunsuke Granzow: Marthenia RollingBLAND, SCOTT Treating Reo Portela/Extender: Linwood DibblesSTONE III, HOYT Weeks in Treatment: 0 Active Problems Location of Pain Severity and Description of Pain Patient Has Paino Yes Site Locations Pain Location: Pain in Ulcers With Dressing Change: Yes Rate the pain. Current Pain Level: 8 Character of Pain Describe the Pain: Burning Pain Management and Medication Current Pain Management: Electronic Signature(s) Signed: 05/02/2019 9:32:15 AM By: Elliot GurneyWoody, BSN, RN, CWS, Kim RN, BSN Entered By: Elliot GurneyWoody, BSN, RN, CWS, Kim on 04/28/2019 12:38:58 Jeffrey DillingMUSHUNJU, Jeffrey Lowe ([id number]) -------------------------------------------------------------------------------- Patient/Caregiver Education Details Patient Name: Jeffrey DillingMUSHUNJU, Jeffrey Lowe Date of Service: 04/28/2019 12:30 PM Medical Record Number: [id number] Patient Account Number: [id number] Date of Birth/Gender: 1981-04-18 (38 y.o. M) Treating RN: Arnette NorrisBiell, Kristina Primary Care  Physician: Janit PaganENIOLA, KEHINDE Other Clinician: Referring Physician: Marthenia RollingBLAND, SCOTT Treating Physician/Extender: Skeet SimmerSTONE III, HOYT Weeks in Treatment: 0 Education Assessment Education Provided To: Patient Education Topics Provided Wound/Skin Impairment: Handouts: Caring for Your Ulcer Methods: Demonstration, Explain/Verbal Responses: State content correctly Electronic Signature(s) Signed: 04/28/2019 4:17:37 PM By: Arnette NorrisBiell, Kristina Entered By: Arnette NorrisBiell, Kristina on 04/28/2019 13:12:03 Jeffrey DillingMUSHUNJU, Jeffrey Lowe ([id number]) -------------------------------------------------------------------------------- Wound Assessment Details Patient Name: Jeffrey DillingMUSHUNJU, Jeffrey Lowe Date of Service: 04/28/2019 12:30 PM Medical Record Number: [id number] Patient Account Number: [id number] Date of Birth/Sex: 1981-04-18 (38 y.o. M) Treating RN: Huel CoventryWoody, Kim Primary Care Waverly Chavarria: Janit PaganENIOLA, KEHINDE Other Clinician: Referring Torunn Chancellor: Marthenia RollingBLAND, SCOTT Treating Verlie Hellenbrand/Extender: Linwood DibblesSTONE III, HOYT Weeks in Treatment: 0 Wound Status Wound Number: 1 Primary Etiology: 2nd degree Burn Wound Location: Right Upper Leg - Proximal Wound Status: Open Wounding Event: Thermal Burn Date Acquired: 04/25/2019 Weeks Of Treatment: 0 Clustered Wound: No Photos Wound Measurements Length: (cm) 17 Width: (cm) 6.2 Depth: (cm) 0.1 Area: (cm) 82.781 Volume: (cm) 8.278 % Reduction in Area: 0% % Reduction in Volume: 0% Epithelialization: None Tunneling: No Undermining: No Wound Description Full Thickness Without Exposed Support Foul Od Classification: Structures Slough/ Wound Margin: Flat and Intact Exudate Medium Amount: Exudate Type:  Serous Exudate Color: amber or After Cleansing: No Fibrino Yes Wound Bed Granulation Amount: Medium (34-66%) Exposed Structure Granulation Quality: Red Fascia Exposed: No Necrotic Amount: Medium (34-66%) Fat Layer (Subcutaneous Tissue) Exposed: Yes Necrotic Quality: Adherent Slough Tendon Exposed: No Muscle  Exposed: No Joint Exposed: No Bone Exposed: No Jeffrey DillingMUSHUNJU, Jeffrey Lowe ([id number]) Electronic Signature(s) Signed: 04/29/2019 11:49:53 AM By: Elliot GurneyWoody, BSN, RN, CWS, Kim RN, BSN Entered By: Elliot GurneyWoody, BSN, RN, CWS, Kim on 04/29/2019 11:49:52 Jeffrey DillingMUSHUNJU, Tahji ([id number]) -------------------------------------------------------------------------------- Wound Assessment Details Patient Name: Jeffrey DillingMUSHUNJU, Ramsey Date of Service: 04/28/2019 12:30 PM Medical Record Number: [id number] Patient Account Number: [id number] Date of Birth/Sex: 11-19-1980 (38 y.o. M) Treating RN: Huel CoventryWoody, Kim Primary Care Quenten Nawaz: Janit PaganENIOLA, KEHINDE Other Clinician: Referring Quinisha Mould: Marthenia RollingBLAND, SCOTT Treating Kelyse Pask/Extender: Linwood DibblesSTONE III, HOYT Weeks in Treatment: 0 Wound Status Wound Number: 2 Primary Etiology: 2nd degree Burn Wound Location: Right Upper Leg - Medial Wound Status: Open Wounding Event: Thermal Burn Date Acquired: 04/25/2019 Weeks Of Treatment: 0 Clustered Wound: No Photos Wound Measurements Length: (cm) 1.2 Width: (cm) 1.2 Depth: (cm) 0.1 Area: (cm) 1.131 Volume: (cm) 0.113 % Reduction in Area: 0% % Reduction in Volume: 0% Epithelialization: Small (1-33%) Tunneling: No Undermining: No Wound Description Full Thickness Without Exposed Support Classification: Structures Wound Margin: Flat and Intact Exudate Medium Amount: Exudate Type: Serous Exudate Color: amber Foul Odor After Cleansing: No Slough/Fibrino Yes Wound Bed Granulation Amount: Medium (34-66%) Exposed Structure Granulation Quality: Red Fascia Exposed: No Necrotic Amount: Medium (34-66%) Fat Layer (Subcutaneous Tissue) Exposed: Yes Necrotic Quality: Adherent Slough Tendon Exposed: No Muscle Exposed: No Joint Exposed: No Bone Exposed: No Jeffrey DillingMUSHUNJU, Shemar ([id number]) Electronic Signature(s) Signed: 04/29/2019 11:52:47 AM By: Elliot GurneyWoody, BSN, RN, CWS, Kim RN, BSN Previous Signature: 04/29/2019 11:50:12 AM Version By: Elliot GurneyWoody, BSN, RN, CWS, Kim  RN, BSN Entered By: Elliot GurneyWoody, BSN, RN, CWS, Kim on 04/29/2019 11:52:47 Jeffrey DillingMUSHUNJU, Decari ([id number]) -------------------------------------------------------------------------------- Wound Assessment Details Patient Name: Jeffrey DillingMUSHUNJU, Jojo Date of Service: 04/28/2019 12:30 PM Medical Record Number: [id number] Patient Account Number: [id number] Date of Birth/Sex: 11-19-1980 (38 y.o. M) Treating RN: Huel CoventryWoody, Kim Primary Care Tayt Moyers: Janit PaganENIOLA, KEHINDE Other Clinician: Referring Fairy Ashlock: Marthenia RollingBLAND, SCOTT Treating Mariyam Remington/Extender: STONE III, HOYT Weeks in Treatment: 0 Wound Status Wound Number: 3 Primary Etiology: 2nd degree Burn Wound Location: Left Upper Leg - Distal Wound Status: Open Wounding Event: Thermal Burn Date Acquired: 04/25/2019 Weeks Of Treatment: 0 Clustered Wound: Yes Photos Wound Measurements Length: (cm) 1.2 Width: (cm) 3 Depth: (cm) 0.1 Clustered Quantity: 2 Area: (cm) 2.827 Volume: (cm) 0.283 % Reduction in Area: 0% % Reduction in Volume: 0% Epithelialization: None Tunneling: No Undermining: No Wound Description Full Thickness Without Exposed Support Foul Od Classification: Structures Slough/ Wound Margin: Flat and Intact Exudate Medium Amount: Exudate Type: Serous Exudate Color: amber or After Cleansing: No Fibrino No Wound Bed Granulation Amount: None Present (0%) Exposed Structure Necrotic Amount: Large (67-100%) Fascia Exposed: No Necrotic Quality: Eschar, Adherent Slough Fat Layer (Subcutaneous Tissue) Exposed: Yes Tendon Exposed: No Muscle Exposed: No Joint Exposed: No Bone Exposed: No Jeffrey DillingMUSHUNJU, Rembert ([id number]) Electronic Signature(s) Signed: 04/29/2019 11:50:22 AM By: Elliot GurneyWoody, BSN, RN, CWS, Kim RN, BSN Entered By: Elliot GurneyWoody, BSN, RN, CWS, Kim on 04/29/2019 11:50:22 Jeffrey DillingMUSHUNJU, Lucas ([id number]) -------------------------------------------------------------------------------- Vitals Details Patient Name: Jeffrey DillingMUSHUNJU, Monta Date of Service: 04/28/2019  12:30 PM Medical Record Number: [id number] Patient Account Number: [id number] Date of Birth/Sex: 11-19-1980 (38 y.o. M) Treating RN: Huel CoventryWoody, Kim Primary Care Pranavi Aure: Janit PaganENIOLA, KEHINDE Other Clinician: Referring Fontaine Kossman: Marthenia RollingBLAND, SCOTT Treating Eshani Maestre/Extender: STONE III, HOYT Weeks in Treatment: 0 Vital Signs Time Taken:  12:39 Temperature (F): 97.9 Height (in): 66 Pulse (bpm): 59 Source: Measured Respiratory Rate (breaths/min): 16 Weight (lbs): 230 Blood Pressure (mmHg): 135/71 Source: Measured Reference Range: 80 - 120 mg / dl Body Mass Index (BMI): 37.1 Electronic Signature(s) Signed: 05/02/2019 9:32:15 AM By: Elliot Gurney, BSN, RN, CWS, Kim RN, BSN Entered By: Elliot Gurney, BSN, RN, CWS, Kim on 04/28/2019 12:41:39

## 2019-05-02 NOTE — Progress Notes (Signed)
Jeffrey Lowe, Jeffrey Lowe ([id number]) Visit Report for 04/28/2019 Abuse/Suicide Risk Screen Details Patient Name: Jeffrey Lowe, [name] Date of Service: 04/28/2019 12:30 PM Medical Record Number: [id number] Patient Account Number: [id number] Date of Birth/Sex: 06-07-1981 (38 y.o. M) Treating RN: Huel CoventryWoody, Kim Primary Care Aunika Kirsten: Janit PaganENIOLA, KEHINDE Other Clinician: Referring Jakaiya Netherland: Marthenia RollingBLAND, SCOTT Treating Parnell Spieler/Extender: Linwood DibblesSTONE III, HOYT Weeks in Treatment: 0 Abuse/Suicide Risk Screen Items Answer ABUSE RISK SCREEN: Has anyone close to you tried to hurt or harm you recentlyo No Do you feel uncomfortable with anyone in your familyo No Has anyone forced you do things that you didnot want to doo No Electronic Signature(s) Signed: 05/02/2019 9:32:15 AM By: Elliot GurneyWoody, BSN, RN, CWS, Kim RN, BSN Entered By: Elliot GurneyWoody, BSN, RN, CWS, Kim on 04/28/2019 12:47:51 Jeffrey Lowe, Braddock ([id number]) -------------------------------------------------------------------------------- Activities of Daily Living Details Patient Name: Jeffrey Lowe, Jeffrey Lowe Date of Service: 04/28/2019 12:30 PM Medical Record Number: [id number] Patient Account Number: [id number] Date of Birth/Sex: 06-07-1981 (38 y.o. M) Treating RN: Huel CoventryWoody, Kim Primary Care Socrates Cahoon: Janit PaganENIOLA, KEHINDE Other Clinician: Referring Oisin Yoakum: Marthenia RollingBLAND, SCOTT Treating Alayna Mabe/Extender: Linwood DibblesSTONE III, HOYT Weeks in Treatment: 0 Activities of Daily Living Items Answer Activities of Daily Living (Please select one for each item) Drive Automobile Completely Able Take Medications Completely Able Use Telephone Completely Able Care for Appearance Completely Able Use Toilet Completely Able Bath / Shower Completely Able Dress Self Completely Able Feed Self Completely Able Walk Completely Able Get In / Out Bed Completely Able Housework Completely Able Prepare Meals Completely Able Handle Money Completely Able Shop for Self Completely Able Electronic Signature(s) Signed: 05/02/2019 9:32:15  AM By: Elliot GurneyWoody, BSN, RN, CWS, Kim RN, BSN Entered By: Elliot GurneyWoody, BSN, RN, CWS, Kim on 04/28/2019 12:48:04 Jeffrey Lowe, Jeffrey Lowe ([id number]) -------------------------------------------------------------------------------- Education Screening Details Patient Name: Jeffrey Lowe, Brett Date of Service: 04/28/2019 12:30 PM Medical Record Number: [id number] Patient Account Number: [id number] Date of Birth/Sex: 06-07-1981 (38 y.o. M) Treating RN: Huel CoventryWoody, Kim Primary Care Aanya Haynes: Janit PaganENIOLA, KEHINDE Other Clinician: Referring Ignace Mandigo: Marthenia RollingBLAND, SCOTT Treating Cherrill Scrima/Extender: Skeet SimmerSTONE III, HOYT Weeks in Treatment: 0 Primary Learner Assessed: Patient Learning Preferences/Education Level/Primary Language Learning Preference: Explanation Highest Education Level: College or Above Preferred Language: English Cognitive Barrier Language Barrier: No Translator Needed: No Memory Deficit: No Emotional Barrier: No Physical Barrier Impaired Vision: No Impaired Hearing: No Decreased Hand dexterity: No Knowledge/Comprehension Knowledge Level: High Comprehension Level: High Ability to understand written High instructions: Ability to understand verbal High instructions: Motivation Anxiety Level: Calm Cooperation: Cooperative Education Importance: Acknowledges Need Interest in Health Problems: Asks Questions Perception: Coherent Willingness to Engage in Self- High Management Activities: Readiness to Engage in Self- High Management Activities: Electronic Signature(s) Signed: 05/02/2019 9:32:15 AM By: Elliot GurneyWoody, BSN, RN, CWS, Kim RN, BSN Entered By: Elliot GurneyWoody, BSN, RN, CWS, Kim on 04/28/2019 12:48:31 Jeffrey Lowe, Trigo ([id number]) -------------------------------------------------------------------------------- Fall Risk Assessment Details Patient Name: Jeffrey Lowe, Jeffrey Lowe Date of Service: 04/28/2019 12:30 PM Medical Record Number: [id number] Patient Account Number: [id number] Date of Birth/Sex: 06-07-1981 (38 y.o.  M) Treating RN: Huel CoventryWoody, Kim Primary Care Avaeh Ewer: Janit PaganENIOLA, KEHINDE Other Clinician: Referring Garl Speigner: Marthenia RollingBLAND, SCOTT Treating Derhonda Eastlick/Extender: Linwood DibblesSTONE III, HOYT Weeks in Treatment: 0 Fall Risk Assessment Items Have you had 2 or more falls in the last 12 monthso 0 No Have you had any fall that resulted in injury in the last 12 monthso 0 No FALLS RISK SCREEN History of falling - immediate or within 3 months 0 No Secondary diagnosis (Do you have 2 or more medical diagnoseso) 0 No Ambulatory aid None/bed rest/wheelchair/nurse 0 Yes Crutches/cane/walker 0 No Furniture 0 No Intravenous therapy Access/Saline/Heparin [employer]Lock  0 No Gait/Transferring Normal/ bed rest/ wheelchair 0 Yes Weak (short steps with or without shuffle, stooped but able to lift head while 0 No walking, may seek support from furniture) Impaired (short steps with shuffle, may have difficulty arising from chair, head 0 No down, impaired balance) Mental Status Oriented to own ability 0 Yes Electronic Signature(s) Signed: 05/02/2019 9:32:15 AM By: Gretta Cool, BSN, RN, CWS, Kim RN, BSN Entered By: Gretta Cool, BSN, RN, CWS, Kim on 04/28/2019 12:48:47 Jeffrey Lowe ([id number]) -------------------------------------------------------------------------------- Foot Assessment Details Patient Name: Jeffrey Lowe Date of Service: 04/28/2019 12:30 PM Medical Record Number: [id number] Patient Account Number: [id number] Date of Birth/Sex: 09/05/1980 (38 y.o. M) Treating RN: Cornell Barman Primary Care Kiley Solimine: Andrena Mews Other Clinician: Referring Bruce Churilla: Sherene Sires Treating Liliana Brentlinger/Extender: Melburn Hake, HOYT Weeks in Treatment: 0 Foot Assessment Items Site Locations + = Sensation present, - = Sensation absent, C = Callus, U = Ulcer R = Redness, W = Warmth, M = Maceration, PU = Pre-ulcerative lesion F = Fissure, S = Swelling, D = Dryness Assessment Right: Left: Other Deformity: No No Prior Foot Ulcer: No No Prior Amputation:  No No Charcot Joint: No No Ambulatory Status: Ambulatory Without Help Gait: Steady Electronic Signature(s) Signed: 05/02/2019 9:32:15 AM By: Gretta Cool, BSN, RN, CWS, Kim RN, BSN Entered By: Gretta Cool, BSN, RN, CWS, Kim on 04/28/2019 12:49:12 Jeffrey Lowe ([id number]) -------------------------------------------------------------------------------- Nutrition Risk Screening Details Patient Name: Jeffrey Lowe Date of Service: 04/28/2019 12:30 PM Medical Record Number: [id number] Patient Account Number: [id number] Date of Birth/Sex: [birth year] (38 y.o. M) Treating RN: Cornell Barman Primary Care Pegeen Stiger: Andrena Mews Other Clinician: Referring Robert Sperl: Sherene Sires Treating Reginaldo Hazard/Extender: Melburn Hake, HOYT Weeks in Treatment: 0 Height (in): 66 Weight (lbs): 230 Body Mass Index (BMI): 37.1 Nutrition Risk Screening Items Score Screening NUTRITION RISK SCREEN: I have an illness or condition that made me change the kind and/or amount of 0 No food I eat I eat fewer than two meals per day 0 No I eat few fruits and vegetables, or milk products 0 No I have three or more drinks of beer, liquor or wine almost every day 0 No I have tooth or mouth problems that make it hard for me to eat 0 No I don't always have enough money to buy the food I need 0 No I eat alone most of the time 0 No I take three or more different prescribed or over-the-counter drugs a day 0 No Without wanting to, I have lost or gained 10 pounds in the last six months 0 No I am not always physically able to shop, cook and/or feed myself 0 No Nutrition Protocols Good Risk Protocol 0 No interventions needed Moderate Risk Protocol High Risk Proctocol Risk Level: Good Risk Score: 0 Electronic Signature(s) Signed: 05/02/2019 9:32:15 AM By: Gretta Cool, BSN, RN, CWS, Kim RN, BSN Entered By: Gretta Cool, BSN, RN, CWS, Kim on 04/28/2019 12:49:04

## 2019-05-05 ENCOUNTER — Ambulatory Visit: Payer: Medicaid Other | Admitting: Physician Assistant

## 2019-07-23 ENCOUNTER — Ambulatory Visit: Payer: Medicaid Other | Admitting: Family Medicine

## 2020-12-12 NOTE — Progress Notes (Deleted)
   Subjective:   Patient ID: Jeffrey Lowe    DOB: 02-25-1981, 40 y.o. male   MRN: [id number]  Jeffrey Lowe is a 40 y.o. 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   HPI: Patient had lab work completed at work. He notes *   Review of Systems:  Per HPI.   Objective:   There were no vitals taken for this visit. Vitals and nursing note reviewed.  General: pleasant ***, sitting comfortably in exam chair, well nourished, well developed, in no acute distress with non-toxic appearance HEENT: normocephalic, atraumatic, moist mucous membranes, oropharynx clear without erythema or exudate, TM normal bilaterally  Neck: supple, non-tender without lymphadenopathy CV: regular rate and rhythm without murmurs, rubs, or gallops, no lower extremity edema, 2+ radial and pedal pulses bilaterally Lungs: clear to auscultation bilaterally with normal work of breathing on room air Resp: breathing comfortably on room air, speaking in full sentences Abdomen: soft, non-tender, non-distended, no masses or organomegaly palpable, normoactive bowel sounds Skin: warm, dry, no rashes or lesions Extremities: warm and well perfused, normal tone MSK: ROM grossly intact, strength intact, gait normal Neuro: Alert and oriented, speech normal  Assessment & Plan:   No problem-specific Assessment & Plan notes found for this encounter.  No orders of the defined types were placed in this encounter.  No orders of the defined types were placed in this encounter.   {    This will disappear when note is signed, click to select method of visit    :1}  Orpah Cobb, DO PGY-3, [facility] 12/12/2020 9:56 AM

## 2020-12-13 ENCOUNTER — Ambulatory Visit: Payer: Medicaid Other

## 2020-12-14 NOTE — Progress Notes (Signed)
    SUBJECTIVE:   CHIEF COMPLAINT / HPI: lab f/u  40 yo patient presents with labs he got on 11/22/20 from work. CBC WNL, CMP with ALT elevated to 46. Triglycerides 162, LDL 121, rest of lipid panel WNL. Patient has gained about 40 lbs since 2020, now 243 lbs. Patient has h/o chronic hep B infection from 2015 when he immigrated to the [place]. Initially went through refugee/immigration clinic. Patient has not had LFT elevation before. 2015 labs with Hep B DNA <20,000, hep b e not tested. Previously there had been plan to get RUQ [place] with elastography, not done. Patient denies abdominal pain, no RUQ pain.  PERTINENT  PMH / PSH: chronic hep b infection  OBJECTIVE:   BP 98/62   Pulse 68   Wt 243 lb 12.8 oz (110.6 kg)   SpO2 96%   BMI 39.35 kg/m   Nursing note and vitals reviewed GEN: age-appropriate, AAM, resting comfortably in chair, NAD, obese HEENT: NCAT. Sclera without injection or icterus.  Cardiac: Regular rate and rhythm. Normal S1/S2. No murmurs, rubs, or gallops appreciated. 2+ radial pulses. Lungs: Clear bilaterally to ascultation. No increased WOB, no accessory muscle usage. No w/r/r. Abdomen: Normoactive bowel sounds. No tenderness to deep or light palpation. No rebound or guarding. No HSM. Neuro: Alert and at baseline Ext: no edema Psych: Pleasant and appropriate  ASSESSMENT/PLAN:   Hepatitis B antibody positive ALT with mild elevation, will recheck CMP today. Will recheck Hep B DNA, as well as Hep B e antibody and antigen. Will get RUQ [place] + elastography and refer to [facility].   Hyperlipidemia Counseled on diet and exercise. Patient does not meet criteria for starting statin. Recommend increasing fruits and vegetables, increasing exercise.     Shirlean Mylar, MD [facility]

## 2020-12-15 ENCOUNTER — Ambulatory Visit (INDEPENDENT_AMBULATORY_CARE_PROVIDER_SITE_OTHER): Payer: 59 | Admitting: Family Medicine

## 2020-12-15 ENCOUNTER — Other Ambulatory Visit: Payer: Self-pay

## 2020-12-15 VITALS — BP 98/62 | HR 68 | Wt 243.8 lb

## 2020-12-15 DIAGNOSIS — B181 Chronic viral hepatitis B without delta-agent: Secondary | ICD-10-CM

## 2020-12-15 DIAGNOSIS — E782 Mixed hyperlipidemia: Secondary | ICD-10-CM

## 2020-12-15 DIAGNOSIS — R768 Other specified abnormal immunological findings in serum: Secondary | ICD-10-CM

## 2020-12-15 NOTE — Patient Instructions (Signed)
It was a pleasure to see you today!  1. We will get some labs today.  If they are abnormal or we need to do something about them, I will call you.  If they are normal, I will send you a message on MyChart (if it is active) or a letter in the mail.  If you don't hear from [place] in 2 weeks, please call the office  [phone].  2. We will schedule an ultrasound of your liver to check on it. My nurse will let you know when that is scheduled.  3. I recommend walking 3 times a week for at least 30 minutes after dinner to help lower your cholesterol (fat in blood).  4. Please follow up in 2-4 weeks with your doctor   Be Well,  Dr. Leary Roca

## 2020-12-16 LAB — COMPREHENSIVE METABOLIC PANEL: eGFR: 93 mL/min/{1.73_m2} (ref >59)

## 2020-12-16 LAB — HEPATITIS B DNA, ULTRAQUANTITATIVE, PCR

## 2020-12-17 ENCOUNTER — Other Ambulatory Visit: Payer: Self-pay | Admitting: Family Medicine

## 2020-12-17 ENCOUNTER — Encounter: Payer: Self-pay | Admitting: Family Medicine

## 2020-12-17 DIAGNOSIS — B181 Chronic viral hepatitis B without delta-agent: Secondary | ICD-10-CM

## 2020-12-17 LAB — COMPREHENSIVE METABOLIC PANEL
ALT: 47 IU/L — ABNORMAL HIGH (ref 0–44)
AST: 31 IU/L (ref 0–40)
Albumin/Globulin Ratio: 1.4 (ref 1.2–2.2)
Albumin: 4.2 g/dL (ref 4.0–5.0)
Alkaline Phosphatase: 79 IU/L (ref 44–121)
BUN/Creatinine Ratio: 8 — ABNORMAL LOW (ref 9–20)
BUN: 9 mg/dL (ref 6–20)
Bilirubin Total: 0.6 mg/dL (ref 0.0–1.2)
CO2: 23 mmol/L (ref 20–29)
Calcium: 9 mg/dL (ref 8.7–10.2)
Chloride: 104 mmol/L (ref 96–106)
Creatinine, Ser: 1.06 mg/dL (ref 0.76–1.27)
Globulin, Total: 2.9 g/dL (ref 1.5–4.5)
Glucose: 95 mg/dL (ref 65–99)
Potassium: 4.2 mmol/L (ref 3.5–5.2)
Sodium: 139 mmol/L (ref 134–144)
Total Protein: 7.1 g/dL (ref 6.0–8.5)

## 2020-12-17 LAB — HEPATITIS B E ANTIBODY: Hep B E Ab: POSITIVE — AB

## 2020-12-17 LAB — HEPATITIS B E ANTIGEN: Hep B E Ag: NEGATIVE

## 2020-12-17 LAB — HEPATITIS B DNA, ULTRAQUANTITATIVE, PCR: HBV DNA SERPL PCR-ACNC: 410 IU/mL

## 2020-12-17 NOTE — Progress Notes (Signed)
Reviewed lab results: hep B DNA <20k, Hep B E ab positive, ag negative. Patient has upcoming RUQ [place] with elastography. Discussed case with Dr. Manson Passey, will refer to [facility]. Will call on Monday to help coordinate referral. Left HIPAA compliant VM for patient, will send letter.  Shirlean Mylar, MD [facility], PGY-2

## 2020-12-18 DIAGNOSIS — E785 Hyperlipidemia, unspecified: Secondary | ICD-10-CM | POA: Insufficient documentation

## 2020-12-18 NOTE — Assessment & Plan Note (Signed)
Counseled on diet and exercise. Patient does not meet criteria for starting statin. Recommend increasing fruits and vegetables, increasing exercise.

## 2020-12-18 NOTE — Assessment & Plan Note (Signed)
ALT with mild elevation, will recheck CMP today. Will recheck Hep B DNA, as well as Hep B e antibody and antigen. Will get RUQ [place] + elastography and refer to [facility].

## 2020-12-21 ENCOUNTER — Ambulatory Visit (HOSPITAL_COMMUNITY)
Admission: RE | Admit: 2020-12-21 | Discharge: 2020-12-21 | Disposition: A | Discharge status: Home / Self Care | Payer: 59 | Source: Ambulatory Visit | Attending: Family Medicine | Admitting: Family Medicine

## 2020-12-21 ENCOUNTER — Other Ambulatory Visit: Payer: Self-pay

## 2020-12-21 DIAGNOSIS — B181 Chronic viral hepatitis B without delta-agent: Secondary | ICD-10-CM | POA: Diagnosis not present

## 2020-12-22 ENCOUNTER — Telehealth: Payer: Self-pay | Admitting: Family Medicine

## 2020-12-22 NOTE — Telephone Encounter (Signed)
Reviewed RUQ [place] with elastography- there is hyperechoic density, but elastography was within normal limits. This is reassuring and likely no cirrhosis present at this time. Patient does have chronic Hep C and will have an appointment with Dr. Earlene Plater at [facility] on 12/29/20.  I attempted to call patient to let him know results. Left HIPAA compliant VM. If patient calls back, please let him know that the [place] was reassuring and to go to above appt.  Shirlean Mylar, MD [facility], PGY-2

## 2020-12-23 NOTE — Telephone Encounter (Signed)
Patient returned call and was informed of message.  Jone Baseman, CMA

## 2020-12-27 ENCOUNTER — Other Ambulatory Visit (HOSPITAL_COMMUNITY): Payer: Self-pay

## 2020-12-27 ENCOUNTER — Telehealth: Payer: Self-pay

## 2020-12-27 NOTE — Telephone Encounter (Signed)
[facility] Patient Product/process development scientist completed.    The patient is insured through [employer]  and has a $2936.74 copay.  Patient have a high copay with all the medication in this class. He will need a copay coupon card.   We will continue to follow to see if copay assistance is needed.  Clearance Coots, CPhT Specialty Pharmacy Patient [facility] Phone: [phone] Fax:  [phone]

## 2020-12-29 ENCOUNTER — Ambulatory Visit: Payer: 59 | Admitting: Internal Medicine

## 2021-01-04 ENCOUNTER — Encounter: Payer: Self-pay | Admitting: Internal Medicine

## 2021-01-04 ENCOUNTER — Other Ambulatory Visit: Payer: Self-pay

## 2021-01-04 ENCOUNTER — Ambulatory Visit (INDEPENDENT_AMBULATORY_CARE_PROVIDER_SITE_OTHER): Payer: 59 | Admitting: Internal Medicine

## 2021-01-04 DIAGNOSIS — B181 Chronic viral hepatitis B without delta-agent: Secondary | ICD-10-CM

## 2021-01-04 NOTE — Progress Notes (Signed)
[facility]      Reason for Consult: chronic inactive hepatitis B    Referring Physician: Dr. Miquel Dunn    Patient ID: Jeffrey Lowe, male    DOB: [birth year], 40 y.o.   MRN: [id number]  HPI:   He is here for evaluation of hepatitis B.   He was seen here by Dr. Orvan Falconer in 2016 with a positive hepatitis B DNA with low level viremia and then did not return for follow up.  Other relevant labs included a negative hepatitis D Ab, negative hepatitis C antibody and positive hepatitis A Ab. Since then he had labs done by his current primary physician more recently including a HBV viral DNA of 410 IU/mL, negative hepatitis E Ag and positive E Ab.  His brother, who also has immigrated here is hepatitis B positive as well and his parents are soon to immigrate here too.  He is unaware if his mother is hepatitis B positive.  He has never had a blood transfusion.  He has lived in a refugee [place] before moving to the [place].  He is unaware of any contact with used needles, no tattoos.  Ultrasound with a low probability elastography and the ultrasound noted hepatosteatosis.  AST 31 and ALT 47.    PMH:   Prior to Admission medications   Medication Sig Start Date End Date Taking? Authorizing Provider  lidocaine (XYLOCAINE) 2 % solution Use as directed 20 mLs in the mouth or throat every 4 (four) hours as needed for mouth pain. Patient not taking: No sig reported 12/15/15   Jamal Collin, MD  magic mouthwash w/lidocaine SOLN Take 5 mLs by mouth 3 (three) times daily as needed for mouth pain. Patient not taking: No sig reported 12/15/15   Jamal Collin, MD    No Known Allergies  Social History   Tobacco Use  . Smoking status: Never Smoker  . Smokeless tobacco: Never Used  Substance Use Topics  . Alcohol use: No  . Drug use: No    Family History  Problem Relation Age of Onset  . Diabetes Mother   . Hepatitis B Brother        Tested positive at same time as him    Review  of Systems  Constitutional: negative for fevers, sweats, fatigue, malaise and anorexia Gastrointestinal: negative for nausea, vomiting and diarrhea Integument/breast: negative for rash Musculoskeletal: negative for myalgias and arthralgias All other systems reviewed and are negative    Constitutional: in no apparent distress  Vitals:   01/04/21 1455  BP: 112/71  Pulse: 61  Temp: 98.1 F (36.7 C)   EYES: anicteric ENMT:no thrush Cardiovascular: Cor RRR Respiratory: clear GI: Bowel sounds are normal, liver is not enlarged, spleen is not enlarged Musculoskeletal: no pedal edema noted Skin: negatives: no rash Neuro: non-focal  Labs: Lab Results  Component Value Date   WBC 4.1 04/01/2015   HGB 15.4 04/01/2015   HCT 43.9 04/01/2015   MCV 89.0 04/01/2015   PLT 172 04/01/2015    Lab Results  Component Value Date   CREATININE 1.06 12/15/2020   BUN 9 12/15/2020   NA 139 12/15/2020   K 4.2 12/15/2020   CL 104 12/15/2020   CO2 23 12/15/2020    Lab Results  Component Value Date   ALT 47 (H) 12/15/2020   AST 31 12/15/2020   ALKPHOS 79 12/15/2020   BILITOT 0.6 12/15/2020     Assessment: chronic hepatitis B, negative E Ag, possible  inactive carrier state.  Previous testing with a low viral DNA and same for recent testing.  He is E Ag negative but no significant indication for treatment.  AST and ALT are up some but c/w his hepatosteatosis.  Hepatosteatosis.  Noted on ultrasound and also with transaminitis.  I discussed, weight loss, increased exercise and decreased carbs.    Plan: 1) labs and ultrasound in 6 months and every 6 months ongoing.

## 2021-01-05 ENCOUNTER — Encounter: Payer: Self-pay | Admitting: Internal Medicine

## 2021-01-07 ENCOUNTER — Ambulatory Visit: Payer: Medicaid Other | Admitting: Family Medicine

## 2021-01-07 NOTE — Progress Notes (Deleted)
    SUBJECTIVE:   CHIEF COMPLAINT / HPI:   ***  Chronic Hep B- follows with ID, saw Dr Luciana Axe, recommending q6 month labs and [place]. Next [place] scheduled 07/11/2021. Recent ultrasound without hepatic lesions. Dr Luciana Axe discussed weight loss and exercise for hepatosteatosis noted on [place]. No indication for Hep B treatment at this time.   PERTINENT  PMH / PSH: chronic hepatitis B  OBJECTIVE:   There were no vitals taken for this visit.  ***  ASSESSMENT/PLAN:   No problem-specific Assessment & Plan notes found for this encounter.     Billey Co, MD [place]   {    This will disappear when note is signed, click to select method of visit    :1}

## 2021-07-08 ENCOUNTER — Ambulatory Visit: Payer: 59 | Admitting: Internal Medicine

## 2021-07-11 ENCOUNTER — Other Ambulatory Visit: Payer: 59

## 2021-07-11 ENCOUNTER — Ambulatory Visit: Payer: Self-pay | Admitting: Internal Medicine

## 2022-02-07 ENCOUNTER — Encounter: Payer: Self-pay | Admitting: *Deleted

## 2022-06-22 ENCOUNTER — Emergency Department (HOSPITAL_COMMUNITY): Payer: No Typology Code available for payment source

## 2022-06-22 ENCOUNTER — Emergency Department (HOSPITAL_COMMUNITY)
Admission: EM | Admit: 2022-06-22 | Discharge: 2022-06-22 | Disposition: A | Discharge status: Home / Self Care | Payer: No Typology Code available for payment source | Attending: Emergency Medicine | Admitting: Emergency Medicine

## 2022-06-22 ENCOUNTER — Other Ambulatory Visit: Payer: Self-pay

## 2022-06-22 DIAGNOSIS — M545 Low back pain, unspecified: Secondary | ICD-10-CM | POA: Diagnosis not present

## 2022-06-22 DIAGNOSIS — Y92410 Unspecified street and highway as the place of occurrence of the external cause: Secondary | ICD-10-CM | POA: Insufficient documentation

## 2022-06-22 DIAGNOSIS — R109 Unspecified abdominal pain: Secondary | ICD-10-CM | POA: Diagnosis present

## 2022-06-22 MED ORDER — IBUPROFEN 800 MG PO TABS: 800.0000 mg | ORAL_TABLET | Freq: Three times a day (TID) | ORAL | 0 refills | Status: AC
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
## 2022-12-05 IMAGING — US US ABDOMEN LIMITED W/ ELASTOGRAPHY
1 series · 12 of 25 positions shown · non-contrast
Comparison: None.

CLINICAL DATA: Screen, chronic hepatitis-B

EXAM:
US ABDOMEN LIMITED - RIGHT UPPER QUADRANT
ULTRASOUND HEPATIC ELASTOGRAPHY
TECHNIQUE: Sonography of the right upper quadrant was performed. In addition,
ultrasound elastography evaluation of the liver was performed. A
region of interest was placed within the right lobe of the liver.
Following application of a compressive sonographic pulse, tissue
compressibility was assessed. Multiple assessments were performed at
the selected site. Median tissue compressibility was determined.
Previously, hepatic stiffness was assessed by shear wave velocity.
Based on recently published Society of Radiologists in Ultrasound
consensus article, reporting is now recommended to be performed in
the SI units of pressure (kiloPascals) representing hepatic
stiffness/elasticity. The obtained result is compared to the
published reference standards. (cACLD = compensated Advanced Chronic
Liver Disease)

[Series 1: us abdomen ruq w/elastography · 12 of 58 slices shown]
[im 3/58]
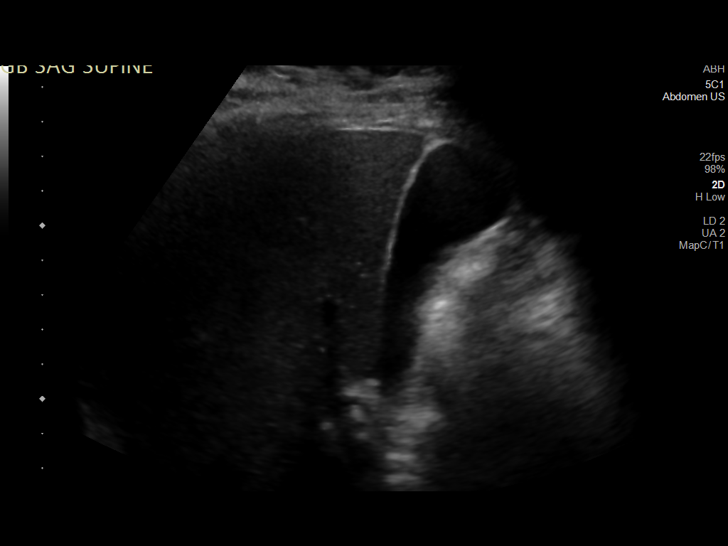
[im 8/58]
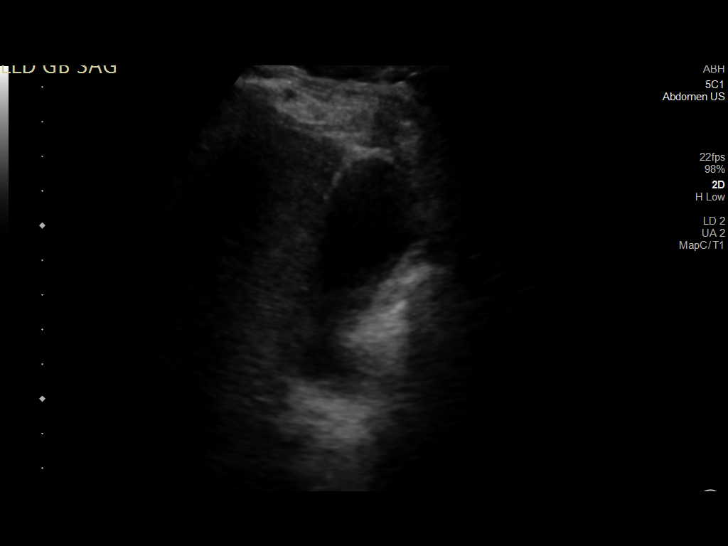
[im 12/58]
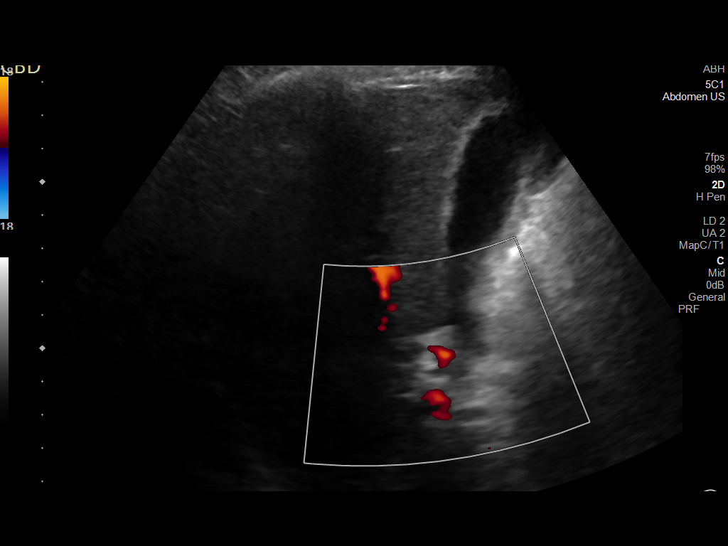
[im 17/58]
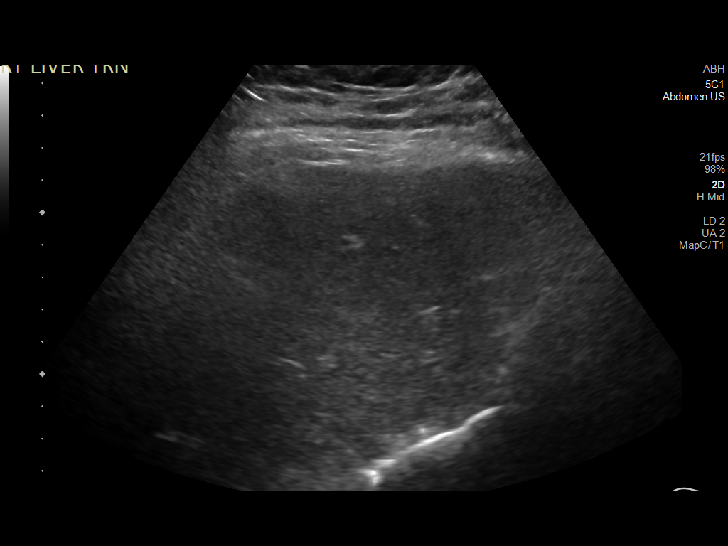
[im 22/58]
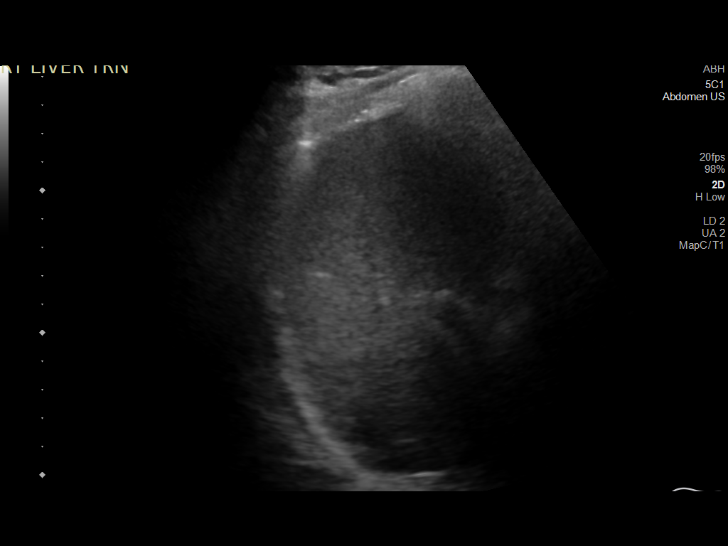
[im 27/58]
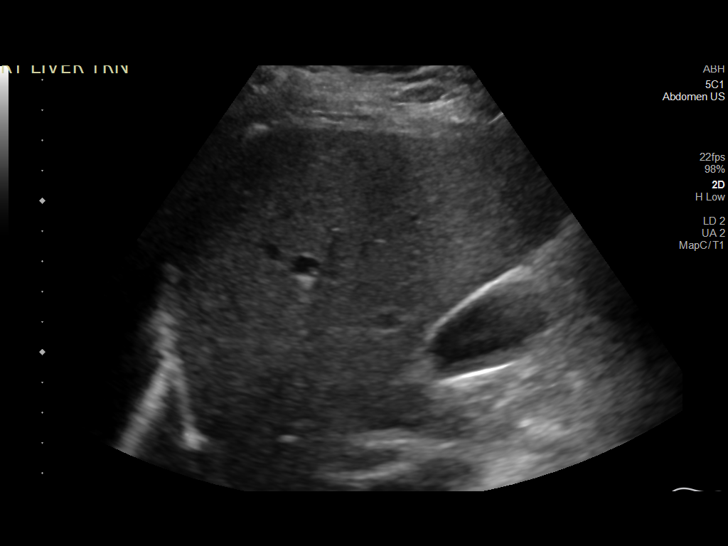
[im 31/58]
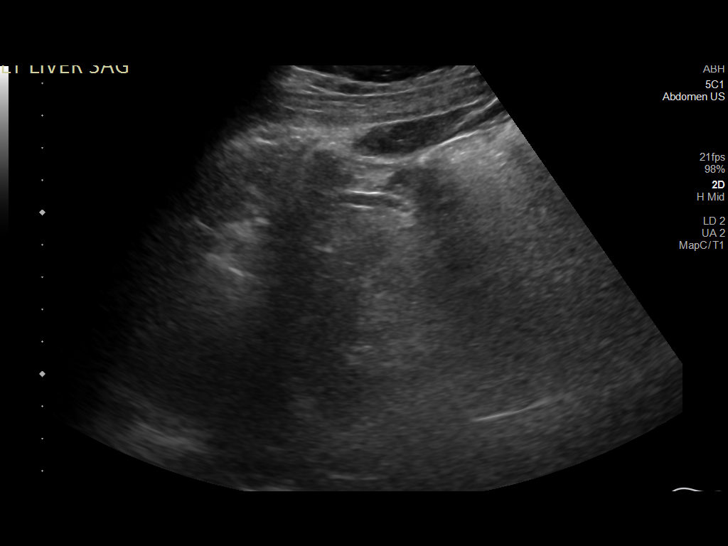
[im 36/58]
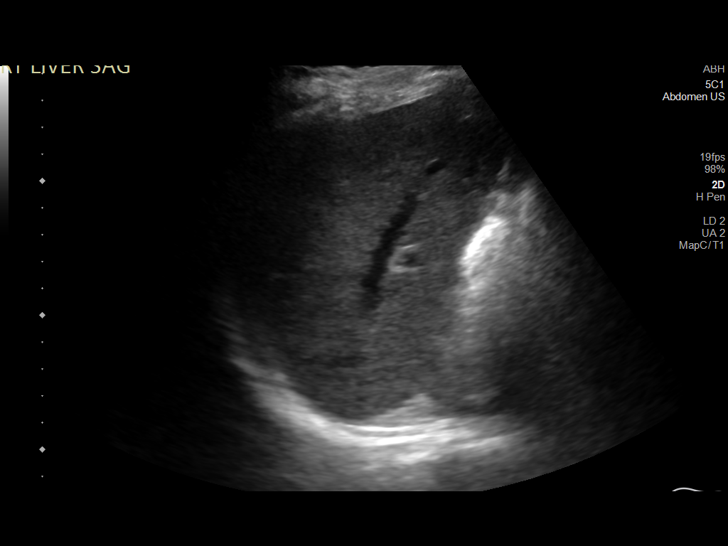
[im 41/58]
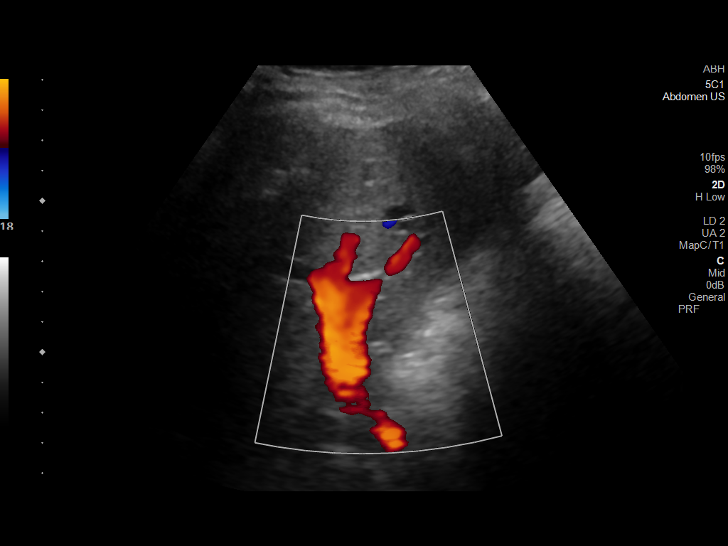
[im 46/58]
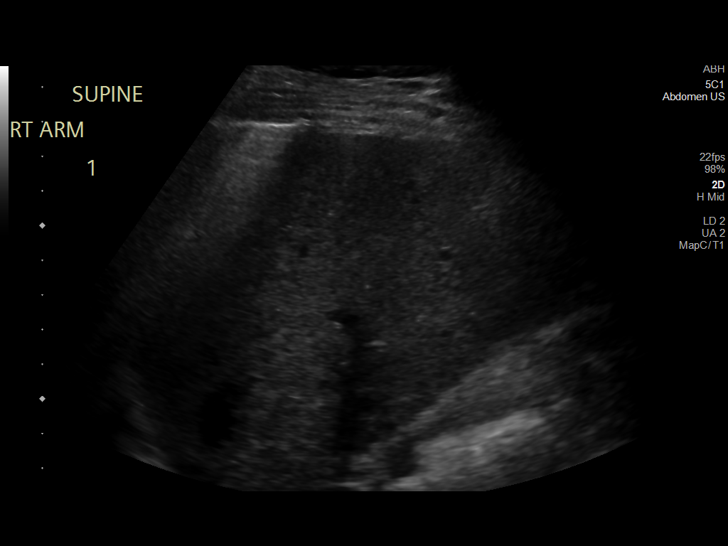
[im 50/58]
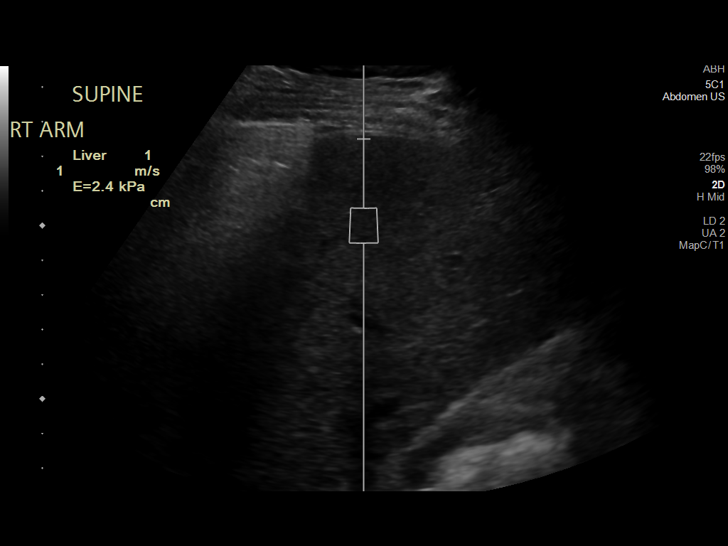
[im 55/58]
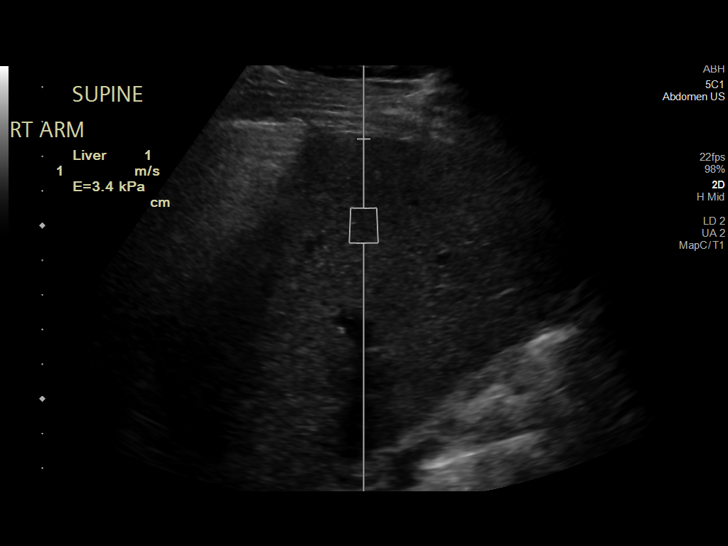

[12 of 25 positions shown; findings below may reference images not displayed]

FINDINGS: ULTRASOUND ABDOMEN LIMITED RIGHT UPPER QUADRANT

Gallbladder:

No gallstones or wall thickening visualized. No sonographic Murphy
sign noted.

Common bile duct:

Diameter: 4 mm

Liver:

No focal lesion identified. Coarsened hepatic echotexture with
increased echogenicity. Portal vein is patent on color Doppler
imaging with normal direction of blood flow towards the liver.

ULTRASOUND HEPATIC ELASTOGRAPHY

Device: Siemens Helix VTQ

Patient position: Supine

Transducer [id number]

Number of measurements: 10

Hepatic segment:  8

Median kPa:

IQR:

IQR/Median kPa ratio:

Data quality:  Good

Diagnostic category:  < or = 5 kPa: high probability of being normal

The use of hepatic elastography is applicable to patients with viral
hepatitis and non-alcoholic fatty liver disease. At this time, there
is insufficient data for the referenced cut-off values and use in
other causes of liver disease, including alcoholic liver disease.
Patients, however, may be assessed by elastography and serve as
their own reference standard/baseline.

In patients with non-alcoholic liver disease, the values suggesting
compensated advanced chronic liver disease (cACLD) may be lower, and
patients may need additional testing with elasticity results of [DATE]
kPa.

Please note that abnormal hepatic elasticity and shear wave
velocities may also be identified in clinical settings other than
with hepatic fibrosis, such as: acute hepatitis, elevated right
heart and central venous pressures including use of beta blockers,
Dilone disease (Tigresse), infiltrative processes such as
mastocytosis/amyloidosis/infiltrative tumor/lymphoma, extrahepatic
cholestasis, with hyperemia in the post-prandial state, and with
liver transplantation. Correlation with patient history, laboratory
data, and clinical condition recommended.

Diagnostic Categories:

< or =5 kPa: high probability of being normal

< or =9 kPa: in the absence of other known clinical signs, rules [DATE] kPa and ?13 kPa: suggestive of cACLD, but needs further testing

>13 kPa: highly suggestive of cACLD

> or =17 kPa: highly suggestive of cACLD with an increased
probability of clinically significant portal hypertension
IMPRESSION: ULTRASOUND RUQ:

Coarsened hepatic echotexture with increased hepatic echogenicity
most commonly seen with hepatic steatosis or hepatocellular disease.
No overt hepatic lesions visualized.

ULTRASOUND HEPATIC ELASTOGRAPHY:

Median kPa:

Diagnostic category:  < or = 5 kPa: high probability of being normal

## 2024-02-15 LAB — AMB RESULTS CONSOLE CBG: Glucose: 129

## 2024-02-18 NOTE — Progress Notes (Signed)
 No Sdoh needs currently declined. BP slightly elevated 133/94

## 2024-05-15 NOTE — Progress Notes (Signed)
 The patient attended a screening event on 02/15/24 where his BP screening results were 130/90. At the event the patient did not note having a pcp. Pt declined the sdoh questionnaire. Per chart review pt does have a pcp but has not seen her in years. Pt also has insurance per chart review. Chart review did not indicate any future appts.   CHW spoke with pt and he states he has been going to the hospital instead. Pt states that he would like pcp resources via email. ([email or web address]). CHW has sent curtesy bp resources as well as a get care now flyer. An additional follow up will be done in according to the health equity team's protocol.

## 2024-06-30 NOTE — Progress Notes (Signed)
 Pt attended 02/15/24 screening event where his bp was 130/90 and his blood sugar was 129. At the event pt did not document having a PCP nor did he document having insurance. Pt noted no SDOH needs at the time of the event. Chart review indicated that pt does have a PCP listed as Pray,Margaret MD and has insurance. Further chart review indicates that pt has not seen his PCP in about 3 years. This CHW called pt to follow up on PCP status. Pt answered and stated that he did receive the resources from our initial HE follow up but he thinks he wants to stay with his listed provider. Pt also stated he will contact her when he needs to but at this time he does not need to see a Doctor. CHW offered to schedule an appt but pt refused. Pt stated that he is fine for now and does not need any further assistance.  No future follow up to be scheduled per HE protocol.
